# Patient Record
Sex: Male | Born: 1975 | Race: White | Hispanic: No | Smoking: Never smoker
Health system: Southern US, Community
[De-identification: ages and names within clinical notes are randomized; demographics above are authoritative.]

## PROBLEM LIST (undated history)

## (undated) DIAGNOSIS — I10 Essential (primary) hypertension: Secondary | ICD-10-CM

## (undated) DIAGNOSIS — R011 Cardiac murmur, unspecified: Secondary | ICD-10-CM

## (undated) DIAGNOSIS — E119 Type 2 diabetes mellitus without complications: Secondary | ICD-10-CM

## (undated) HISTORY — PX: KNEE SURGERY: SHX244

---

## 2014-09-19 ENCOUNTER — Ambulatory Visit (HOSPITAL_COMMUNITY)
Admission: RE | Admit: 2014-09-19 | Discharge: 2014-09-19 | Disposition: A | Discharge status: Home / Self Care | Payer: BLUE CROSS/BLUE SHIELD | Source: Ambulatory Visit | Attending: Internal Medicine | Admitting: Internal Medicine

## 2014-09-19 ENCOUNTER — Other Ambulatory Visit (HOSPITAL_COMMUNITY): Payer: Self-pay | Admitting: Internal Medicine

## 2014-09-19 DIAGNOSIS — M25511 Pain in right shoulder: Secondary | ICD-10-CM | POA: Diagnosis present

## 2016-04-12 ENCOUNTER — Observation Stay (HOSPITAL_COMMUNITY)
Admission: EM | Admit: 2016-04-12 | Discharge: 2016-04-13 | Disposition: A | Discharge status: Home / Self Care | Payer: BLUE CROSS/BLUE SHIELD | Attending: Family Medicine | Admitting: Family Medicine

## 2016-04-12 ENCOUNTER — Emergency Department (HOSPITAL_COMMUNITY): Payer: BLUE CROSS/BLUE SHIELD

## 2016-04-12 ENCOUNTER — Encounter (HOSPITAL_COMMUNITY): Payer: Self-pay | Admitting: Emergency Medicine

## 2016-04-12 DIAGNOSIS — R55 Syncope and collapse: Secondary | ICD-10-CM | POA: Diagnosis not present

## 2016-04-12 DIAGNOSIS — Z7984 Long term (current) use of oral hypoglycemic drugs: Secondary | ICD-10-CM | POA: Insufficient documentation

## 2016-04-12 DIAGNOSIS — R0789 Other chest pain: Secondary | ICD-10-CM | POA: Diagnosis present

## 2016-04-12 DIAGNOSIS — E119 Type 2 diabetes mellitus without complications: Secondary | ICD-10-CM | POA: Diagnosis not present

## 2016-04-12 DIAGNOSIS — I10 Essential (primary) hypertension: Secondary | ICD-10-CM

## 2016-04-12 DIAGNOSIS — R079 Chest pain, unspecified: Principal | ICD-10-CM

## 2016-04-12 DIAGNOSIS — Z79899 Other long term (current) drug therapy: Secondary | ICD-10-CM | POA: Diagnosis not present

## 2016-04-12 HISTORY — DX: Essential (primary) hypertension: I10

## 2016-04-12 HISTORY — DX: Cardiac murmur, unspecified: R01.1

## 2016-04-12 HISTORY — DX: Type 2 diabetes mellitus without complications: E11.9

## 2016-04-12 LAB — BASIC METABOLIC PANEL
ANION GAP: 9 (ref 5–15)
BUN: 10 mg/dL (ref 6–20)
CALCIUM: 9.5 mg/dL (ref 8.9–10.3)
CO2: 21 mmol/L — AB (ref 22–32)
CREATININE: 0.82 mg/dL (ref 0.61–1.24)
Chloride: 107 mmol/L (ref 101–111)
GFR calc non Af Amer: >60 mL/min (ref >60)
Glucose, Bld: 107 mg/dL — ABNORMAL HIGH (ref 65–99)
Potassium: 3.8 mmol/L (ref 3.5–5.1)
Sodium: 137 mmol/L (ref 135–145)

## 2016-04-12 LAB — CBC
HEMATOCRIT: 40.7 % (ref 39.0–52.0)
Hemoglobin: 14.4 g/dL (ref 13.0–17.0)
MCH: 30.1 pg (ref 26.0–34.0)
MCHC: 35.4 g/dL (ref 30.0–36.0)
MCV: 85.1 fL (ref 78.0–100.0)
Platelets: 431 10*3/uL — ABNORMAL HIGH (ref 150–400)
RBC: 4.78 MIL/uL (ref 4.22–5.81)
RDW: 12.5 % (ref 11.5–15.5)
WBC: 9 10*3/uL (ref 4.0–10.5)

## 2016-04-12 LAB — I-STAT TROPONIN, ED: TROPONIN I, POC: 0 ng/mL (ref 0.00–0.08)

## 2016-04-12 LAB — TROPONIN I

## 2016-04-12 MED ORDER — ACETAMINOPHEN 325 MG PO TABS
650.0000 mg | ORAL_TABLET | ORAL | Status: DC | PRN
Start: 2016-04-12 — End: 2016-04-13

## 2016-04-12 MED ORDER — ASPIRIN EC 325 MG PO TBEC
325.0000 mg | DELAYED_RELEASE_TABLET | Freq: Every day | ORAL | Status: DC
  Administered 2016-04-12 – 2016-04-13 (×2): 325 mg via ORAL
  Filled 2016-04-12 (×2): qty 1

## 2016-04-12 MED ORDER — AMLODIPINE-OLMESARTAN 10-40 MG PO TABS: 1.0000 | ORAL_TABLET | Freq: Every day | ORAL | Status: DC

## 2016-04-12 MED ORDER — NITROGLYCERIN 0.4 MG SL SUBL
0.4000 mg | SUBLINGUAL_TABLET | Freq: Once | SUBLINGUAL | Status: AC
  Administered 2016-04-12: 0.4 mg via SUBLINGUAL
  Filled 2016-04-12: qty 1

## 2016-04-12 MED ORDER — ASPIRIN 325 MG PO TABS
325.0000 mg | ORAL_TABLET | Freq: Once | ORAL | Status: AC
  Administered 2016-04-12: 325 mg via ORAL
  Filled 2016-04-12: qty 1

## 2016-04-12 MED ORDER — IRBESARTAN 300 MG PO TABS
300.0000 mg | ORAL_TABLET | Freq: Every day | ORAL | Status: DC
  Administered 2016-04-13: 300 mg via ORAL
  Filled 2016-04-12: qty 1

## 2016-04-12 MED ORDER — AMLODIPINE BESYLATE 5 MG PO TABS
10.0000 mg | ORAL_TABLET | Freq: Every day | ORAL | Status: DC
  Administered 2016-04-13: 10 mg via ORAL
  Filled 2016-04-12: qty 2

## 2016-04-12 MED ORDER — ONDANSETRON HCL 4 MG/2ML IJ SOLN: 4.0000 mg | Freq: Four times a day (QID) | INTRAMUSCULAR | Status: DC | PRN

## 2016-04-12 MED ORDER — GI COCKTAIL ~~LOC~~: 30.0000 mL | Freq: Four times a day (QID) | ORAL | Status: DC | PRN

## 2016-04-12 NOTE — ED Notes (Signed)
Pt ready and stable for transport to [facility] room 335.

## 2016-04-12 NOTE — ED Notes (Signed)
Pharmacy at bedside

## 2016-04-12 NOTE — ED Triage Notes (Signed)
Patient c/o left side chest pain that radiates into left "shoulder blade." Per patient pain started around 2pm in which he became diaphoretic and nauseated. Patient states he called girfriend at 2ish and left message telling her he wasn't feeling good and then waking up face down on floor with dog licking face around 4. Per patient left arm was cold upon waking and inconstant temp change in arm noted. Denies any cardiac hx other then heart murmur.

## 2016-04-12 NOTE — H&P (Signed)
History and Physical    LAWERNCE EARLL XAJ:[id number] DOB: [birth year] DOA: 04/12/2016  PCP: Wende Neighbors, MD  Patient coming from:  home  Chief Complaint:   Chest pain and passed out  HPI: Christian Peters is a 40 y.o. male with medical history significant of HTN, DM comes in with acute on chronic episode of chest pain with associated syncope today.  Pt says he has been having these episodes of pain to the left lateral chest for years, it comes on suddenly and lasts for less than 5 minutes usually, usually associated with exertion not associated with shoulder use.  He went to see his PCP saw the NP there who did an ekg and blood work told him everything was fine.  She called him Friday and said she may be setting up additional cardiac work up and they would call back the next week.  Tonight pt was sitting on the sofa when he got sudden severe left chest pain that was different this time, it radiated down the left chest more central this time and radiated down his left arm with associated diaphoresis.  He got up to go the bathroom and the next thing he remembered he was woken up on the floor by the dog licking his face, he says he was passed out for 2 hours.  He had no urinary or bowel incontinence.  He awoke and was generally weak but was able to get up and move around.  The pain is gone now.  He has never had a cardiac work up.  He denies any focal neurological deficits at this time.he was told by his PCP to start taking a baby aspirin a day but he has not started this yet.  He denies any fevers, cough.  No le edema or swelling.  No worsening of pain with deep inspiration.  Review of Systems: As per HPI otherwise 10 point review of systems negative.   Past Medical History:  Diagnosis Date  . Diabetes mellitus without complication ([place])   . Heart murmur   . Hypertension     Past Surgical History:  Procedure Laterality Date  . KNEE SURGERY Right    x4     reports that he has never smoked. He has  never used smokeless tobacco. He reports that he does not drink alcohol or use drugs.  No Known Allergies  Family History  Problem Relation Age of Onset  . Cancer Mother   . Cancer Father     Prior to Admission medications   Medication Sig Start Date End Date Taking? Authorizing Provider  amLODipine-olmesartan (AZOR) 10-40 MG tablet Take 1 tablet by mouth daily.  04/10/16  Yes Historical Provider, MD  metFORMIN (GLUCOPHAGE) 500 MG tablet Take 500 mg by mouth 2 (two) times daily with a meal.   Yes Historical Provider, MD  clonazePAM (KLONOPIN) 0.5 MG tablet Take 0.5 mg by mouth daily as needed for anxiety.  04/08/16   Historical Provider, MD    Physical Exam: Vitals:   04/12/16 1830 04/12/16 1840 04/12/16 1845 04/12/16 1900  BP: 116/84 115/86 122/92 136/91  Pulse: 83 79 87 81  Resp: 14 16 16 19   Temp:      TempSrc:      SpO2: 96% 97% 97% 96%  Weight:      Height:          Constitutional: NAD, calm, comfortable Vitals:   04/12/16 1830 04/12/16 1840 04/12/16 1845 04/12/16 1900  BP: 116/84 115/86 122/92 136/91  Pulse: 83 79 87 81  Resp: 14 16 16 19   Temp:      TempSrc:      SpO2: 96% 97% 97% 96%  Weight:      Height:       Eyes: PERRL, lids and conjunctivae normal ENMT: Mucous membranes are moist. Posterior pharynx clear of any exudate or lesions.Normal dentition.  Neck: normal, supple, no masses, no thyromegaly Respiratory: clear to auscultation bilaterally, no wheezing, no crackles. Normal respiratory effort. No accessory muscle use.  Cardiovascular: Regular rate and rhythm, no murmurs / rubs / gallops. No extremity edema. 2+ pedal pulses. No carotid bruits.  Abdomen: no tenderness, no masses palpated. No hepatosplenomegaly. Bowel sounds positive.  Musculoskeletal: no clubbing / cyanosis. No joint deformity upper and lower extremities. Good ROM, no contractures. Normal muscle tone.  Skin: no rashes, lesions, ulcers. No induration Neurologic: CN 2-12 grossly intact.  Sensation intact, DTR normal. Strength 5/5 in all 4.  Psychiatric: Normal judgment and insight. Alert and oriented x 3. Normal mood.    Labs on Admission: I have personally reviewed following labs and imaging studies  CBC:  Recent Labs Lab 04/12/16 1824  WBC 9.0  HGB 14.4  HCT 40.7  MCV 85.1  PLT 106*   Basic Metabolic Panel:  Recent Labs Lab 04/12/16 1750  NA 137  K 3.8  CL 107  CO2 21*  GLUCOSE 107*  BUN 10  CREATININE 0.82  CALCIUM 9.5   GFR: Estimated Creatinine Clearance: 173.9 mL/min (by C-G formula based on SCr of 0.82 mg/dL).  Radiological Exams on Admission: Dg Chest 2 View  Result Date: 04/12/2016 CLINICAL DATA:  Left-sided chest pain EXAM: CHEST  2 VIEW COMPARISON:  None. FINDINGS: Normal cardiac silhouette. There is elevation of the RIGHT hemidiaphragm. No effusion, infiltrate pneumothorax. IMPRESSION: Elevation RIGHT hemidiaphragm. No acute cardiopulmonary process. Electronically Signed   By: Suzy Bouchard M.D.   On: 04/12/2016 18:07    EKG: Independently reviewed. nsr no acute issues   Assessment/Plan 40 yo male with DM, HTN comes in with waxing/waning atypical chest pain with syncopal episode today  Principal Problem:   Chest pain- aspirin daily.  Romi.  Cardiac monitoring.  Obtain cardiac echo in the am and pt will need stress testing at some point as an outpatient if all work up here negative.  Active Problems:   Syncope- this sounds vasovagal but pt reports he was passed out for 2 hours.  No neuro symptoms.  No history concerning for seizure.  Monitor overnight.  No further imaging needed at this time   Hypertension- stable   Diabetes mellitus without complication ([place])- hold metformin     DVT prophylaxis:  scds Code Status:   full Family Communication: none Disposition Plan:  Per day team Consults called:  none Admission status:  obs   DAVID,RACHAL A MD [facility]  If 7PM-7AM, please contact  night-coverage [email or web address] Password [email or web address]  04/12/2016, 7:20 PM

## 2016-04-12 NOTE — ED Provider Notes (Signed)
Emergency Department Provider Note   I have reviewed the triage vital signs and the nursing notes.   HISTORY  Chief Complaint Chest Pain   HPI Christian Peters is a 40 y.o. male with past medical history of diabetes and hypertension presents to the emergency department for evaluation of acute on chronic chest pain and syncope. The patient states he has had intermittent chest pain for several years but over the past 1-2 weeks that's been worsening. Today he was sitting on the couch when he began having his typical chest discomfort but then noted that it was significantly more painful and slightly different location. He began to feel nauseated and had diaphoresis. Patient states that thing here members he woke up on the floor 2 hours later being licked on the face by his dog. He continued to have chest discomfort and at that time presented to the hospital.No prior history of heart attack. No recent stress test or echocardiogram.    Past Medical History:  Diagnosis Date  . Diabetes mellitus without complication ([place])   . Heart murmur   . Hypertension     There are no active problems to display for this patient.   Past Surgical History:  Procedure Laterality Date  . KNEE SURGERY Right    x4    Current Outpatient Rx  . Order #: [id number] Class: Historical Med  . Order #: [id number] Class: Historical Med  . Order #: [id number] Class: Historical Med    Allergies Review of patient's allergies indicates no known allergies.  Family History  Problem Relation Age of Onset  . Cancer Mother   . Cancer Father     Social History Social History  Substance Use Topics  . Smoking status: Never Smoker  . Smokeless tobacco: Never Used  . Alcohol use No    Review of Systems  Constitutional: No fever/chills Eyes: No visual changes. ENT: No sore throat. Cardiovascular: Positive chest pain and syncope.  Respiratory: Denies shortness of breath. Gastrointestinal: No abdominal pain.   No nausea, no vomiting.  No diarrhea.  No constipation. Genitourinary: Negative for dysuria. Musculoskeletal: Negative for back pain. Skin: Negative for rash. Neurological: Negative for headaches, focal weakness or numbness.  10-point ROS otherwise negative.  ____________________________________________   PHYSICAL EXAM:  VITAL SIGNS: ED Triage Vitals  Enc Vitals Group     BP 04/12/16 1730 (!) 170/103     Pulse Rate 04/12/16 1730 95     Resp 04/12/16 1730 22     Temp 04/12/16 1731 98.9 F (37.2 C)     Temp Source 04/12/16 1731 Oral     SpO2 04/12/16 1730 98 %     Weight 04/12/16 1729 294 lb (133.4 kg)     Height 04/12/16 1729 6' 2"  (1.88 m)     Pain Score 04/12/16 1729 8   Constitutional: Alert and oriented. Well appearing and in no acute distress. Eyes: Conjunctivae are normal.  Head: Atraumatic. Nose: No congestion/rhinnorhea. Mouth/Throat: Mucous membranes are moist.  Oropharynx non-erythematous. Neck: No stridor.  Cardiovascular: Normal rate, regular rhythm. Good peripheral circulation. Grossly normal heart sounds.   Respiratory: Normal respiratory effort.  No retractions. Lungs CTAB. Gastrointestinal: Soft and nontender. No distention.  Musculoskeletal: No lower extremity tenderness nor edema. No gross deformities of extremities. Neurologic:  Normal speech and language. No gross focal neurologic deficits are appreciated.  Skin:  Skin is warm, dry and intact. No rash noted. Psychiatric: Mood and affect are normal. Speech and behavior are normal.  ____________________________________________   LABS (  all labs ordered are listed, but only abnormal results are displayed)  Labs Reviewed  BASIC METABOLIC PANEL - Abnormal; Notable for the following:       Result Value   CO2 21 (*)    Glucose, Bld 107 (*)    All other components within normal limits  CBC - Abnormal; Notable for the following:    Platelets 431 (*)    All other components within normal limits  I-STAT  TROPOININ, ED   ____________________________________________  EKG   EKG Interpretation  Date/Time:  Saturday April 12 2016 18:20:04 EDT Ventricular Rate:  81 PR Interval:    QRS Duration: 115 QT Interval:  372 QTC Calculation: 432 R Axis:   45 Text Interpretation:  Sinus rhythm Nonspecific intraventricular conduction delay No STEMI. Improved baseline.  Confirmed by Dakota Stangl MD, Laloni Rowton [id number] on 04/12/2016 6:42:57 PM       EKG Interpretation  Date/Time:  Saturday April 12 2016 18:20:04 EDT Ventricular Rate:  81 PR Interval:    QRS Duration: 115 QT Interval:  372 QTC Calculation: 432 R Axis:   45 Text Interpretation:  Sinus rhythm Nonspecific intraventricular conduction delay No STEMI. Improved baseline.  Confirmed by Shivon Hackel MD, Zak Gondek [id number] on 04/12/2016 6:42:57 PM       ____________________________________________  RADIOLOGY  Dg Chest 2 View  Result Date: 04/12/2016 CLINICAL DATA:  Left-sided chest pain EXAM: CHEST  2 VIEW COMPARISON:  None. FINDINGS: Normal cardiac silhouette. There is elevation of the RIGHT hemidiaphragm. No effusion, infiltrate pneumothorax. IMPRESSION: Elevation RIGHT hemidiaphragm. No acute cardiopulmonary process. Electronically Signed   By: Suzy Bouchard M.D.   On: 04/12/2016 18:07   ____________________________________________   PROCEDURES  Procedure(s) performed:   Procedures  None ____________________________________________   INITIAL IMPRESSION / ASSESSMENT AND PLAN / ED COURSE  Pertinent labs & imaging results that were available during my care of the patient were reviewed by me and considered in my medical decision making (see chart for details).  Patient resents to the emergency department for evaluation of chest pain and syncope. His chest pain is worsening and slightly different location today. The patient's story is very concerning. His EKG shows no significant ischemia or primary arrhythmia. Labs pending. We'll give  aspirin and nitroglycerin.  07:03 PM Patient with no chest pain currently. EKG and repeat EKG reviewed. Troponin negative. Plan for admission for syncope evaluation and further monitoring. Discussed this with the patient and friends at bedside.   Discussed patient's case with hospitalist, Dr. Shanon Brow.  Recommend admission to obs, telemetry bed.  I will place holding orders per their request. Patient and family (if present) updated with plan. Care transferred to hospitalist service.  I reviewed all nursing notes, vitals, pertinent old records, EKGs, labs, imaging (as available).  ____________________________________________  FINAL CLINICAL IMPRESSION(S) / ED DIAGNOSES  Final diagnoses:  Syncope and collapse  Nonspecific chest pain     MEDICATIONS GIVEN DURING THIS VISIT:  Medications  aspirin tablet 325 mg (325 mg Oral Given 04/12/16 1819)  nitroGLYCERIN (NITROSTAT) SL tablet 0.4 mg (0.4 mg Sublingual Given 04/12/16 1820)     NEW OUTPATIENT MEDICATIONS STARTED DURING THIS VISIT:  None   Note:  This document was prepared using Dragon voice recognition software and may include unintentional dictation errors.  Nanda Quinton, MD Emergency Medicine   Margette Fast, MD 04/13/16 1128

## 2016-04-13 ENCOUNTER — Observation Stay (HOSPITAL_BASED_OUTPATIENT_CLINIC_OR_DEPARTMENT_OTHER): Payer: BLUE CROSS/BLUE SHIELD

## 2016-04-13 DIAGNOSIS — R55 Syncope and collapse: Secondary | ICD-10-CM | POA: Diagnosis not present

## 2016-04-13 DIAGNOSIS — E119 Type 2 diabetes mellitus without complications: Secondary | ICD-10-CM | POA: Diagnosis not present

## 2016-04-13 DIAGNOSIS — I10 Essential (primary) hypertension: Secondary | ICD-10-CM | POA: Diagnosis not present

## 2016-04-13 DIAGNOSIS — R079 Chest pain, unspecified: Secondary | ICD-10-CM | POA: Diagnosis not present

## 2016-04-13 LAB — TROPONIN I: Troponin I: <0.03 ng/mL (ref <0.03)

## 2016-04-13 LAB — ECHOCARDIOGRAM COMPLETE
HEIGHTINCHES: 74 in
WEIGHTICAEL: 4657.6 [oz_av]

## 2016-04-13 MED ORDER — SODIUM CHLORIDE 0.9% FLUSH: 3.0000 mL | INTRAVENOUS | Status: DC | PRN

## 2016-04-13 MED ORDER — ASPIRIN EC 81 MG PO TBEC: 81.0000 mg | DELAYED_RELEASE_TABLET | Freq: Every day | ORAL | Status: DC

## 2016-04-13 MED ORDER — SODIUM CHLORIDE 0.9% FLUSH
3.0000 mL | Freq: Two times a day (BID) | INTRAVENOUS | Status: DC
  Administered 2016-04-13: 3 mL via INTRAVENOUS

## 2016-04-13 MED ORDER — SODIUM CHLORIDE 0.9 % IV SOLN: 250.0000 mL | INTRAVENOUS | Status: DC | PRN

## 2016-04-13 NOTE — Progress Notes (Signed)
  Echocardiogram 2D Echocardiogram has been performed.  Christian Peters 04/13/2016, 10:45 AM

## 2016-04-13 NOTE — Progress Notes (Signed)
PROGRESS NOTE  Christian Peters NAT:[id number] DOB: [birth year] DOA: 04/12/2016 PCP: Christian Neighbors, MD  Brief Narrative: 40 year-old man with a hx of HTN and DM type 2 presented with CP and an associated episode of syncope. While in the ED, afebrile, hypertensive, both BMP and CBC are unremarkable other than his elevated platelet count of 431. EKG and CXR were negative. He was admitted for further evaluation of his CP.  Assessment/Plan: 1. CP. Some typical and atypical features. Troponins negative. EKG nonacute. Echocardiogram reassuring. 2. Syncope, history suggests vasovagal. The patient was sitting, started have some pain. Stood up and started walking and passed out. Afterwards he felt nauseous. Telemetry unrevealing. No further evaluation suggested at this point. 3. HTN. Currently stable.  4. DM type 2, stable.     Home today.  Discussed in detail with the patient. He's had intermittent chest pain for years. Recently he's had increased stress secondary to personal relationship issues. He's had more chest pain as of late. While his symptoms are concerning, coronary disease is not felt to be highly likely at this point. We discussed further hospitalization versus discharge outpatient follow-up. He elected outpatient follow-up. I recommended close follow-up with his PCP and consideration of outpatient stress testing. He will return for chest pain, recurrent symptoms or worsening of condition. This was discussed in detail.  Summary of chart review: Records examined, no previous office visits or hospitalizations on file.  DVT prophylaxis: SCDs Code Status: Full Family Communication: No family at bedside Disposition Plan: Discharge home once improved  Christian Hodgkins, MD  [facility] Direct contact: [phone] --[address] [place]  --[email or web address]; password [email or web address]  7PM-7AM contact night coverage as above 04/13/2016, 5:08 PM  LOS: 0 days   Consultants:  None  Procedures:  ECHO Left  ventricle: The cavity size was normal. Wall thickness was   increased in a pattern of moderate LVH. Systolic function was   normal. The estimated ejection fraction was in the range of 60%   to 65%. Wall motion was normal; there were no regional wall   motion abnormalities. Left ventricular diastolic function   parameters were normal. - Aortic valve: Valve area (VTI): 3.42 cm^2. Valve area (Vmax):   3.16 cm^2. Valve area (Vmean): 2.92 cm^2. - Technically adequate study.  Antimicrobials:  None  CC: Follow-up CP  Interval history/Subjective: He feels good today. He is tender in the left chest area where he fell secondary syncope.   Yesterday he was sitting in his chair watching football when he started to have CP and sweat. He stood up and started walking when his legs started to shake and he had a syncopal episode. Once he woke up, he started to feel nauseated. He admits to some difficulty breathing at that time with no past history of dyspnea. He also admits to having CP a few times a week. He denies any jaw pain.  He is currently otherwise pain free  ROS:  No dyspnea.  Objective: Vitals:   04/12/16 1915 04/12/16 1930 04/12/16 2016 04/13/16 0623  BP: 126/88 126/86 (!) 146/95 133/83  Pulse: 76 74 80 67  Resp: (!) 9 13 20 18   Temp:   98.5 F (36.9 C) 97.7 F (36.5 C)  TempSrc:   Oral Oral  SpO2: 98% 99% 99% 97%  Weight:   132 kg (291 lb 1.6 oz)   Height:   6' 2"  (1.88 m)     Intake/Output Summary (Last 24 hours) at 04/13/16 1708 Last data filed at 04/13/16  1014  Gross per 24 hour  Intake              363 ml  Output                0 ml  Net              363 ml     Filed Weights   04/12/16 1729 04/12/16 2016  Weight: 133.4 kg (294 lb) 132 kg (291 lb 1.6 oz)    Exam:    Constitutional:  . Appears calm and comfortable Respiratory:  . CTA bilaterally, no w/r/r.  . Respiratory effort normal. No retractions or accessory muscle use Cardiovascular:  . RRR, no  m/r/g . No LE extremity edema   Musculoskeletal:   Left radial pulse is 2+. Perfusion of the left hip appears normal.  I have personally reviewed following labs and imaging studies:  BMP, CBC on admission unremarkable  Troponins negative  Chest x-ray independently reviewed, elevated right hemidiaphragm. No acute disease noted. In addition I agree with radiology interpretation.  EKG 1729, 1820 independently reviewed. Sinus rhythm. No acute changes.  Scheduled Meds: . amLODipine  10 mg Oral Daily   And  . irbesartan  300 mg Oral Daily  . aspirin EC  325 mg Oral Daily  . sodium chloride flush  3 mL Intravenous Q12H   Continuous Infusions:   Principal Problem:   Nonspecific chest pain Active Problems:   Syncope and collapse   Essential hypertension   Diabetes mellitus without complication (Christian Peters)   LOS: 0 days    By signing my name below, I, Christian Peters, attest that this documentation has been prepared under the direction and in the presence of Christian P. Sarajane Jews, MD. Electronically signed: [name], Scribe.  04/13/16, 12:48 PM  I personally performed the services described in this documentation. All medical record entries made by the scribe were at my direction. I have reviewed the chart and agree that the record reflects my personal performance and is accurate and complete. Christian Hodgkins, MD

## 2016-04-13 NOTE — Discharge Summary (Signed)
Physician Discharge Summary  Christian Peters:[id number] DOB: 12-24-1975 DOA: 04/12/2016  PCP: Wende Neighbors, MD  Admit date: 04/12/2016 Discharge date: 04/13/2016  Recommendations for Outpatient Follow-up:  1. Follow up with PCP as an outpatient. Consider outpatient nuclear stress testing.   Follow-up Information    Wende Neighbors, MD. Schedule an appointment as soon as possible for a visit in 1 week(s).   Specialty:  Internal Medicine Contact information: [place] [address] [phone]          Discharge Diagnoses:  1. CPOf unclear etiology 2. Syncope, likely vasovagal 3. Essential HTN 4. DM, type 2  Discharge Condition: Improved Disposition: Home  Diet recommendation: Carb-modified  Filed Weights   04/12/16 1729 04/12/16 2016  Weight: 133.4 kg (294 lb) 132 kg (291 lb 1.6 oz)    History of present illness:  40 year-old man with a hx of HTN and DM type 2 presented with CP and an associated episode of syncope. While in the ED, afebrile, hypertensive, both BMP and CBC are unremarkable other than his elevated platelet count of 431. EKG and CXR were negative. He was admitted for further evaluation of his CP.  Hospital Course:  Observation was unremarkable. No further pain. Telemetry unremarkable. EKG nonacute. Troponins negative. Hospitalization was uncomplicated. Discussed in detail with the patient. He's had intermittent chest pain for years. Recently he's had increased stress secondary to personal relationship issues. He's had more chest pain as of late. While his symptoms are concerning, coronary disease is not felt to be highly likely at this point. We discussed further hospitalization versus discharge outpatient follow-up. He elected outpatient follow-up. I recommended close follow-up with his PCP and consideration of outpatient stress testing. He will return for chest pain, recurrent symptoms or worsening of condition. This was discussed in  detail.  1. CP. Some typical and atypical features. Troponins negative. EKG nonacute. Echocardiogram reassuring. 2. Syncope, history suggests vasovagal. The patient was sitting, started have some pain. Stood up and started walking and passed out. Afterwards he felt nauseous. Telemetry unrevealing. No further evaluation suggested at this point. 3. HTN. Currently stable.  4. DM type 2, stable.   Consultants:  None  Procedures:  ECHO  Left ventricle: The cavity size was normal. Wall thickness was increased in a pattern of moderate LVH. Systolic function was normal. The estimated ejection fraction was in the range of 60% to 65%. Wall motion was normal; there were no regional wall motion abnormalities. Left ventricular diastolic function parameters were normal. - Aortic valve: Valve area (VTI): 3.42 cm^2. Valve area (Vmax): 3.16 cm^2. Valve area (Vmean): 2.92 cm^2. - Technically adequate study.   Antimicrobials:  None   Discharge Instructions  Discharge Instructions    Activity as tolerated - No restrictions    Complete by:  As directed    Diet - low sodium heart healthy    Complete by:  As directed    Diet Carb Modified    Complete by:  As directed    Discharge instructions    Complete by:  As directed    Call your physician or seek immediate medical attention for chest pain, shortness of breath, vomiting, passing out, left arm pain or worsening of condition.       Medication List    STOP taking these medications   clonazePAM 0.5 MG tablet Commonly known as:  KLONOPIN     TAKE these medications   amLODipine-olmesartan 10-40 MG tablet Commonly known as:  AZOR Take 1  tablet by mouth daily.   aspirin EC 81 MG tablet Take 1 tablet (81 mg total) by mouth daily.   metFORMIN 500 MG tablet Commonly known as:  GLUCOPHAGE Take 500 mg by mouth 2 (two) times daily with a meal.      No Known Allergies  The results of significant diagnostics from this  hospitalization (including imaging, microbiology, ancillary and laboratory) are listed below for reference.    Significant Diagnostic Studies: Dg Chest 2 View  Result Date: 04/12/2016 CLINICAL DATA:  Left-sided chest pain EXAM: CHEST  2 VIEW COMPARISON:  None. FINDINGS: Normal cardiac silhouette. There is elevation of the RIGHT hemidiaphragm. No effusion, infiltrate pneumothorax. IMPRESSION: Elevation RIGHT hemidiaphragm. No acute cardiopulmonary process. Electronically Signed   By: Suzy Bouchard M.D.   On: 04/12/2016 18:07   Labs: Basic Metabolic Panel:  Recent Labs Lab 04/12/16 1750  NA 137  K 3.8  CL 107  CO2 21*  GLUCOSE 107*  BUN 10  CREATININE 0.82  CALCIUM 9.5   CBC:  Recent Labs Lab 04/12/16 1824  WBC 9.0  HGB 14.4  HCT 40.7  MCV 85.1  PLT 431*   Cardiac Enzymes:  Recent Labs Lab 04/12/16 2129 04/13/16 0018 04/13/16 0349  TROPONINI <0.03 <0.03 <0.03    Principal Problem:   Nonspecific chest pain Active Problems:   Syncope and collapse   Essential hypertension   Diabetes mellitus without complication ([place])   Time coordinating discharge: 35 minutes  Signed:  Murray Hodgkins, MD [facility] 04/13/2016, 5:11 PM   By signing my name below, I, Hilbert Odor, attest that this documentation has been prepared under the direction and in the presence of Kasidee Voisin P. Sarajane Jews, MD. Electronically signed: Hilbert Odor, Scribe.  04/13/16   I personally performed the services described in this documentation. All medical record entries made by the scribe were at my direction. I have reviewed the chart and agree that the record reflects my personal performance and is accurate and complete. Murray Hodgkins, MD

## 2016-04-13 NOTE — Progress Notes (Signed)
Discharge instructions given, verbalized understanding, out in stable condition ambulatory.

## 2016-04-14 ENCOUNTER — Telehealth (HOSPITAL_COMMUNITY): Payer: Self-pay | Admitting: *Deleted

## 2016-04-14 ENCOUNTER — Other Ambulatory Visit: Payer: Self-pay | Admitting: Adult Health Nurse Practitioner

## 2016-04-14 DIAGNOSIS — I10 Essential (primary) hypertension: Secondary | ICD-10-CM

## 2016-04-14 DIAGNOSIS — I4891 Unspecified atrial fibrillation: Secondary | ICD-10-CM

## 2016-04-14 DIAGNOSIS — R079 Chest pain, unspecified: Secondary | ICD-10-CM

## 2016-04-14 NOTE — Telephone Encounter (Signed)
Patient given detailed instructions per Myocardial Perfusion Study Information Sheet for the test on 04/15/16 at 12:45. Patient notified to arrive 15 minutes early and that it is imperative to arrive on time for appointment to keep from having the test rescheduled.  If you need to cancel or reschedule your appointment, please call the office within 24 hours of your appointment. Failure to do so may result in a cancellation of your appointment, and a $50 no show fee. Patient verbalized understanding.Christian Peters

## 2016-04-15 ENCOUNTER — Ambulatory Visit (HOSPITAL_COMMUNITY): Payer: BLUE CROSS/BLUE SHIELD

## 2016-04-16 ENCOUNTER — Ambulatory Visit (HOSPITAL_COMMUNITY): Payer: BLUE CROSS/BLUE SHIELD

## 2016-04-16 ENCOUNTER — Ambulatory Visit (HOSPITAL_COMMUNITY): Payer: BLUE CROSS/BLUE SHIELD | Attending: Cardiovascular Disease

## 2016-04-16 DIAGNOSIS — I10 Essential (primary) hypertension: Secondary | ICD-10-CM | POA: Insufficient documentation

## 2016-04-16 DIAGNOSIS — R079 Chest pain, unspecified: Secondary | ICD-10-CM | POA: Insufficient documentation

## 2016-04-16 DIAGNOSIS — I4891 Unspecified atrial fibrillation: Secondary | ICD-10-CM | POA: Insufficient documentation

## 2016-04-16 MED ORDER — TECHNETIUM TC 99M TETROFOSMIN IV KIT
32.8000 | PACK | Freq: Once | INTRAVENOUS | Status: AC | PRN
  Administered 2016-04-16: 32.8 via INTRAVENOUS
  Filled 2016-04-16: qty 33

## 2016-04-16 MED ORDER — REGADENOSON 0.4 MG/5ML IV SOLN
0.4000 mg | Freq: Once | INTRAVENOUS | Status: AC
  Administered 2016-04-16: 0.4 mg via INTRAVENOUS

## 2016-04-17 ENCOUNTER — Ambulatory Visit (HOSPITAL_COMMUNITY): Payer: BLUE CROSS/BLUE SHIELD | Attending: Cardiology

## 2016-04-17 LAB — MYOCARDIAL PERFUSION IMAGING
CHL CUP NUCLEAR SSS: 5
CHL CUP RESTING HR STRESS: 92 {beats}/min
CSEPPHR: 117 {beats}/min
LHR: 0.24
LV sys vol: 80 mL
LVDIAVOL: 151 mL (ref 62–150)
SDS: 1
SRS: 4
TID: 0.91

## 2016-04-17 MED ORDER — TECHNETIUM TC 99M TETROFOSMIN IV KIT
31.6000 | PACK | Freq: Once | INTRAVENOUS | Status: AC | PRN
  Administered 2016-04-17: 32 via INTRAVENOUS
  Filled 2016-04-17: qty 32

## 2016-04-25 ENCOUNTER — Telehealth: Payer: Self-pay | Admitting: Family Medicine

## 2016-04-25 NOTE — Telephone Encounter (Signed)
Error, disregard.

## 2016-04-29 ENCOUNTER — Other Ambulatory Visit: Payer: Self-pay

## 2016-04-29 ENCOUNTER — Emergency Department (HOSPITAL_COMMUNITY)
Admission: EM | Admit: 2016-04-29 | Discharge: 2016-04-29 | Disposition: A | Discharge status: Home / Self Care | Payer: BLUE CROSS/BLUE SHIELD | Attending: Emergency Medicine | Admitting: Emergency Medicine

## 2016-04-29 ENCOUNTER — Encounter (HOSPITAL_COMMUNITY): Payer: Self-pay | Admitting: Emergency Medicine

## 2016-04-29 ENCOUNTER — Emergency Department (HOSPITAL_COMMUNITY): Payer: BLUE CROSS/BLUE SHIELD

## 2016-04-29 DIAGNOSIS — R51 Headache: Secondary | ICD-10-CM | POA: Insufficient documentation

## 2016-04-29 DIAGNOSIS — R0789 Other chest pain: Secondary | ICD-10-CM | POA: Diagnosis not present

## 2016-04-29 DIAGNOSIS — Z7984 Long term (current) use of oral hypoglycemic drugs: Secondary | ICD-10-CM | POA: Diagnosis not present

## 2016-04-29 DIAGNOSIS — Z7982 Long term (current) use of aspirin: Secondary | ICD-10-CM | POA: Diagnosis not present

## 2016-04-29 DIAGNOSIS — H539 Unspecified visual disturbance: Secondary | ICD-10-CM | POA: Insufficient documentation

## 2016-04-29 DIAGNOSIS — Z79899 Other long term (current) drug therapy: Secondary | ICD-10-CM | POA: Insufficient documentation

## 2016-04-29 DIAGNOSIS — E119 Type 2 diabetes mellitus without complications: Secondary | ICD-10-CM | POA: Diagnosis not present

## 2016-04-29 DIAGNOSIS — R42 Dizziness and giddiness: Secondary | ICD-10-CM | POA: Insufficient documentation

## 2016-04-29 DIAGNOSIS — R0602 Shortness of breath: Secondary | ICD-10-CM | POA: Insufficient documentation

## 2016-04-29 DIAGNOSIS — R112 Nausea with vomiting, unspecified: Secondary | ICD-10-CM | POA: Insufficient documentation

## 2016-04-29 DIAGNOSIS — R61 Generalized hyperhidrosis: Secondary | ICD-10-CM | POA: Insufficient documentation

## 2016-04-29 DIAGNOSIS — R55 Syncope and collapse: Secondary | ICD-10-CM

## 2016-04-29 DIAGNOSIS — H9319 Tinnitus, unspecified ear: Secondary | ICD-10-CM | POA: Diagnosis not present

## 2016-04-29 DIAGNOSIS — R079 Chest pain, unspecified: Secondary | ICD-10-CM | POA: Diagnosis present

## 2016-04-29 DIAGNOSIS — I10 Essential (primary) hypertension: Secondary | ICD-10-CM | POA: Insufficient documentation

## 2016-04-29 LAB — CBC
HEMATOCRIT: 39.7 % (ref 39.0–52.0)
HEMOGLOBIN: 14 g/dL (ref 13.0–17.0)
MCH: 30.1 pg (ref 26.0–34.0)
MCHC: 35.3 g/dL (ref 30.0–36.0)
MCV: 85.4 fL (ref 78.0–100.0)
Platelets: 378 10*3/uL (ref 150–400)
RBC: 4.65 MIL/uL (ref 4.22–5.81)
RDW: 12.7 % (ref 11.5–15.5)
WBC: 8.2 10*3/uL (ref 4.0–10.5)

## 2016-04-29 LAB — BASIC METABOLIC PANEL
ANION GAP: 7 (ref 5–15)
BUN: 11 mg/dL (ref 6–20)
CALCIUM: 9.1 mg/dL (ref 8.9–10.3)
CHLORIDE: 106 mmol/L (ref 101–111)
CO2: 24 mmol/L (ref 22–32)
Creatinine, Ser: 0.8 mg/dL (ref 0.61–1.24)
GFR calc non Af Amer: >60 mL/min (ref >60)
Glucose, Bld: 113 mg/dL — ABNORMAL HIGH (ref 65–99)
POTASSIUM: 3.7 mmol/L (ref 3.5–5.1)
Sodium: 137 mmol/L (ref 135–145)

## 2016-04-29 LAB — URINALYSIS, ROUTINE W REFLEX MICROSCOPIC
BILIRUBIN URINE: NEGATIVE
Glucose, UA: NEGATIVE mg/dL
Hgb urine dipstick: NEGATIVE
Ketones, ur: NEGATIVE mg/dL
LEUKOCYTES UA: NEGATIVE
NITRITE: NEGATIVE
PH: 5.5 (ref 5.0–8.0)
Protein, ur: NEGATIVE mg/dL

## 2016-04-29 LAB — I-STAT TROPONIN, ED
TROPONIN I, POC: 0 ng/mL (ref 0.00–0.08)
TROPONIN I, POC: 0 ng/mL (ref 0.00–0.08)

## 2016-04-29 LAB — TSH: TSH: 0.381 u[IU]/mL (ref 0.350–4.500)

## 2016-04-29 LAB — T4, FREE: Free T4: 0.88 ng/dL (ref 0.61–1.12)

## 2016-04-29 LAB — TROPONIN I: Troponin I: <0.03 ng/mL (ref <0.03)

## 2016-04-29 MED ORDER — IOPAMIDOL (ISOVUE-370) INJECTION 76%
100.0000 mL | Freq: Once | INTRAVENOUS | Status: AC | PRN
  Administered 2016-04-29: 100 mL via INTRAVENOUS

## 2016-04-29 NOTE — ED Triage Notes (Signed)
Pt reports continuing chest pain since his last visit here. Pt reports pain became much worse today and he also has n/v and dizziness. Pt reports the pain is stabbing.

## 2016-04-29 NOTE — ED Notes (Signed)
Patient stated that when he went from lying position to sitting position he got a "slight headache" but it went away upon standing.  Patient denies any dizziness

## 2016-04-29 NOTE — ED Provider Notes (Signed)
[facility] DEPT Provider Note   CSN: [id number] Arrival date & time: 04/29/16  1207  By signing my name below, I, Rayna Sexton, attest that this documentation has been prepared under the direction and in the presence of Julianne Rice, MD. Electronically Signed: Rayna Sexton, ED Scribe. 04/29/16. 12:50 PM.   History   Chief Complaint Chief Complaint  Patient presents with  . Chest Pain    HPI HPI Comments: Christian Peters is a 40 y.o. male with a h/o heart murmur, DM, HTN who presents to the Emergency Department complaining of stabbing, non-radiating, left upper CP onset this morning at 10:00 AM. He states that his pain began three days ago and this morning while driving to work began to experience a worsening of his pain which he now describes as "stabbing". He reports associated nausea, 1x vomiting, HA, diaphoresis, blurry vision, lightheadedness, intermittent bilateral tinnitus with his most recent occurrence being last week, diffuse burning muscle pain and palpitations. He denies any current modifying factors. He had his BP checked this morning and it was 195/91 at the time and notes a h/o HTN. Pt also reports he has had intermittent hypotension and had his HTN medication decreased by 1/2 beginning 8 days ago. His wife states that he experienced an episode of dizziness and syncope yesterday while at a work meeting. He states this is the second occurrence he has lost consciousness. His most recent stress test was performed on 04/16/2016 showed mildly decreased ejection fraction. He denies any recent travel, leg swelling and family history of DVT/PE. He takes one baby aspirin daily but is otherwise not on anticoagulation. Pt denies a h/o cardiac catheterization and has never having worn a Holter monitor or had h/o thyroid issues.   The history is provided by the patient and the spouse. No language interpreter was used.    Past Medical History:  Diagnosis Date  . Diabetes mellitus  without complication ([place])   . Heart murmur   . Hypertension     Patient Active Problem List   Diagnosis Date Noted  . Syncope and collapse 04/12/2016  . Nonspecific chest pain 04/12/2016  . Essential hypertension   . Diabetes mellitus without complication [facility])     Past Surgical History:  Procedure Laterality Date  . KNEE SURGERY Right    x4       Home Medications    Prior to Admission medications   Medication Sig Start Date End Date Taking? Authorizing Provider  amLODipine-olmesartan (AZOR) 10-40 MG tablet Take 1 tablet by mouth daily.  04/10/16  Yes Historical Provider, MD  aspirin EC 81 MG tablet Take 1 tablet (81 mg total) by mouth daily. 04/13/16  Yes Samuella Cota, MD  metFORMIN (GLUCOPHAGE) 500 MG tablet Take 500 mg by mouth 2 (two) times daily with a meal.   Yes Historical Provider, MD    Family History Family History  Problem Relation Age of Onset  . Cancer Mother   . Cancer Father     Social History Social History  Substance Use Topics  . Smoking status: Never Smoker  . Smokeless tobacco: Never Used  . Alcohol use No     Allergies   Review of patient's allergies indicates no known allergies.   Review of Systems Review of Systems  Constitutional: Positive for diaphoresis. Negative for fever.  HENT: Positive for tinnitus. Negative for ear pain.   Eyes: Positive for visual disturbance.  Respiratory: Positive for shortness of breath. Negative for cough, chest tightness and wheezing.  Cardiovascular: Positive for chest pain and palpitations. Negative for leg swelling.  Gastrointestinal: Positive for nausea and vomiting. Negative for abdominal pain, constipation and diarrhea.  Genitourinary: Negative for difficulty urinating, dysuria, flank pain and frequency.  Musculoskeletal: Positive for myalgias. Negative for arthralgias, back pain, neck pain and neck stiffness.  Skin: Negative for rash and wound.  Neurological: Positive for dizziness,  syncope, light-headedness and headaches. Negative for weakness and numbness.  All other systems reviewed and are negative.  Physical Exam Updated Vital Signs BP 122/87   Pulse 71   Temp 98.2 F (36.8 C) (Oral)   Resp 12   Ht 6' 2"  (1.88 m)   Wt 284 lb (128.8 kg)   SpO2 98%   BMI 36.46 kg/m   Physical Exam  Constitutional: He is oriented to person, place, and time. He appears well-developed and well-nourished. No distress.  HENT:  Head: Normocephalic and atraumatic.  Mouth/Throat: Oropharynx is clear and moist. No oropharyngeal exudate.  Eyes: EOM are normal. Pupils are equal, round, and reactive to light.  Several beats of fatigable rotary nystagmus  Neck: Normal range of motion. Neck supple. No JVD present.  Cardiovascular: Normal rate and regular rhythm.   Pulmonary/Chest: Effort normal and breath sounds normal.  Abdominal: Soft. Bowel sounds are normal. There is no tenderness. There is no rebound and no guarding.  Musculoskeletal: Normal range of motion. He exhibits no edema or tenderness.  Neurological: He is alert and oriented to person, place, and time.  Skin: Skin is warm and dry. No rash noted. No erythema.  Psychiatric: He has a normal mood and affect. His behavior is normal.  Nursing note and vitals reviewed.   ED Treatments / Results  Labs (all labs ordered are listed, but only abnormal results are displayed) Labs Reviewed  BASIC METABOLIC PANEL - Abnormal; Notable for the following:       Result Value   Glucose, Bld 113 (*)    All other components within normal limits  URINALYSIS, ROUTINE W REFLEX MICROSCOPIC (NOT AT [facility]) - Abnormal; Notable for the following:    Specific Gravity, Urine >1.030 (*)    All other components within normal limits  CBC  TSH  TROPONIN I  T4, FREE  I-STAT TROPOININ, ED  I-STAT TROPOININ, ED    EKG  EKG Interpretation None       Radiology Dg Chest 2 View  Result Date: 04/29/2016 CLINICAL DATA:  Chest pain.  Syncopal  episodes. EXAM: CHEST  2 VIEW COMPARISON:  04/12/2016 FINDINGS: The cardiomediastinal silhouette is within normal limits. Moderate elevation of the right hemidiaphragm is unchanged. No airspace consolidation, edema, pleural effusion, or pneumothorax is identified. No acute osseous abnormality is seen. IMPRESSION: No evidence of active cardiopulmonary disease. Unchanged right hemidiaphragm elevation. Electronically Signed   By: Logan Bores M.D.   On: 04/29/2016 13:11   Ct Angio Chest Pe W And/or Wo Contrast  Result Date: 04/29/2016 CLINICAL DATA:  Left-sided stabbing chest pain, worsening over the past 2-3 months with acute worsening today. Evaluate for pulmonary embolism. EXAM: CT ANGIOGRAPHY CHEST WITH CONTRAST TECHNIQUE: Multidetector CT imaging of the chest was performed using the standard protocol during bolus administration of intravenous contrast. Multiplanar CT image reconstructions and MIPs were obtained to evaluate the vascular anatomy. CONTRAST:  100 cc Isovue 370 COMPARISON:  Chest radiograph - 04/29/2016 FINDINGS: Cardiovascular: There is suboptimal opacification of the main pulmonary artery, measuring only 184 Hounsfield units. Given this limitation, there no discrete filling defects within the central pulmonary  arterial tree. Evaluation of the bilateral segmental and subsegmental pulmonary arteries is degraded secondary to a combination of suboptimal vessel opacification as well as patient motion artifact. Normal caliber of the main pulmonary artery. Normal heart size. No pericardial effusion. Normal caliber of the thoracic aorta. No definite thoracic aortic dissection on this nongated examination. Conventional configuration of the aortic arch. The branch vessels of the aortic arch appear patent throughout their imaged course. Mediastinum/Nodes: No mediastinal, hilar axillary lymphadenopathy. Lungs/Pleura: Evaluation of the pulmonary parenchyma is degraded secondary to patient respiratory  artifact. No focal airspace opacities. No pleural effusion or pneumothorax. The central pulmonary airways appear widely patent. No discrete pulmonary nodules given limitation of the examination. Upper Abdomen: There is persistent mild elevation of the right hemidiaphragm. Diffuse decreased attenuation of the hepatic parenchyma on this postcontrast examination suggestive of hepatic steatosis. Note is made of a tiny splenule. Musculoskeletal: No acute or aggressive osseous abnormalities. Specifically, no displaced left-sided rib fractures. Degenerative change of the mid/lower cervical spine. Bilateral gynecomastia.  Normal appearance of the thyroid gland. Review of the MIP images confirms the above findings. IMPRESSION: Respiratory degraded examination without acute cardiopulmonary disease. Specifically, no evidence of central pulmonary embolism. Electronically Signed   By: Sandi Mariscal M.D.   On: 04/29/2016 14:36    Procedures Procedures  DIAGNOSTIC STUDIES: Oxygen Saturation is 97% on RA, normal by my interpretation.    COORDINATION OF CARE: 12:50 PM Discussed next steps with pt. Pt verbalized understanding and is agreeable with the plan.    Medications Ordered in ED Medications  iopamidol (ISOVUE-370) 76 % injection 100 mL (100 mLs Intravenous Contrast Given 04/29/16 1416)     Initial Impression / Assessment and Plan / ED Course  I have reviewed the triage vital signs and the nursing notes.  Pertinent labs & imaging results that were available during my care of the patient were reviewed by me and considered in my medical decision making (see chart for details).  Clinical Course    I personally performed the services described in this documentation, which was scribed in my presence. The recorded information has been reviewed and is accurate.   Vital signs stable in the emergency department. Chest pain is improved. CT angiogram negative for PE. EKG with no ischemic findings. Troponin 2 are  normal. Discussed with Dr. Scarlette Calico. Will arrange to have patient follow-up in office in the next one to 2 days. Advised patient not to drive or operate heavy machinery. Understands need to return immediately for any worsening of his symptoms or concerns. Final Clinical Impressions(s) / ED Diagnoses   Final diagnoses:  Atypical chest pain  Syncope, unspecified syncope type    New Prescriptions New Prescriptions   No medications on file     Julianne Rice, MD 04/29/16 1737

## 2016-05-01 ENCOUNTER — Encounter: Payer: Self-pay | Admitting: *Deleted

## 2016-05-01 ENCOUNTER — Encounter: Payer: Self-pay | Admitting: Internal Medicine

## 2016-05-01 ENCOUNTER — Ambulatory Visit (INDEPENDENT_AMBULATORY_CARE_PROVIDER_SITE_OTHER): Payer: BLUE CROSS/BLUE SHIELD | Admitting: Internal Medicine

## 2016-05-01 VITALS — BP 142/98 | HR 92 | Ht 74.0 in | Wt 284.0 lb

## 2016-05-01 DIAGNOSIS — R55 Syncope and collapse: Secondary | ICD-10-CM

## 2016-05-01 DIAGNOSIS — R079 Chest pain, unspecified: Secondary | ICD-10-CM | POA: Diagnosis not present

## 2016-05-01 NOTE — Progress Notes (Signed)
Cardiology Office Note   Date:  05/01/2016   ID:  Christian Peters, DOB October 07, 1975, MRN [id number]  PCP:  Wende Neighbors, MD  Cardiologist:   Dorris Carnes, MD    PT presents for eval of CP and syncope    History of Present Illness: Christian Peters is a 40 y.o. male with a history of DM, HTN Went to ER on 10/17 with CP   Per er visit pt complained ofconstant pain with occasional   sharp stabbing CP L side  Pain began 3 days prior   Some N/V  HA diaphoresis.  Light headed BP 195/91 at time  Note BP med was recently decreased. Did complain of some chest achiness at work   Pt did exerpeicne dizziness/syncope at work meeting  Second episoded   No recent traviel   CT wa sneg for PE  Trop in ER was negatve      Outpatient Medications Prior to Visit  Medication Sig Dispense Refill  . amLODipine-olmesartan (AZOR) 10-40 MG tablet Take 1 tablet by mouth daily.   3  . aspirin EC 81 MG tablet Take 1 tablet (81 mg total) by mouth daily.    . metFORMIN (GLUCOPHAGE) 500 MG tablet Take 500 mg by mouth 2 (two) times daily with a meal.     No facility-administered medications prior to visit.      Allergies:   Review of patient's allergies indicates no known allergies.   Past Medical History:  Diagnosis Date  . Diabetes mellitus without complication ([place])   . Heart murmur   . Hypertension     Past Surgical History:  Procedure Laterality Date  . KNEE SURGERY Right    x4     Social History:  The patient  reports that he has never smoked. He has never used smokeless tobacco. He reports that he does not drink alcohol or use drugs.   Family History:  The patient's family history includes Cancer in his father and mother.    ROS:  Please see the history of present illness. All other systems are reviewed and  Negative to the above problem except as noted.    PHYSICAL EXAM: VS:  BP (!) 142/98   Pulse 92   Ht 6' 2"  (1.88 m)   Wt 284 lb (128.8 kg)   SpO2 96%   BMI 36.46 kg/m   GEN: Well  nourished, well developed, in no acute distress  HEENT: normal  Neck: no JVD, carotid bruits, or masses Cardiac: RRR; no murmurs, rubs, or gallops,no edema  Respiratory:  clear to auscultation bilaterally, normal work of breathing GI: soft, nontender, nondistended, + BS  No hepatomegaly  MS: no deformity Moving all extremities   Skin: warm and dry, no rash Neuro:  Strength and sensation are intact Psych: euthymic mood, full affect   EKG:  EKG is not ordered today.  In ER SR 77 bpm  Normal intervals   Lipid Panel No results found for: CHOL, TRIG, HDL, CHOLHDL, VLDL, LDLCALC, LDLDIRECT    Wt Readings from Last 3 Encounters:  05/01/16 284 lb (128.8 kg)  04/29/16 284 lb (128.8 kg)  04/16/16 297 lb (134.7 kg)      ASSESSMENT AND PLAN:  1  Chest pain  Two distinct types  Atypical  I am not convinced cardiac   I would check WESR and ANA and RF  2  Dizziness/syncope  I am not convinced it is an arrhythmia  Would set up with event monitor  Current medicines are reviewed at length with the patient today.  The patient does not have concerns regarding medicines.  Signed, Dorris Carnes, MD  05/01/2016 4:26 PM    [address], [place], [place]  [address] Phone: [phone]; Fax: [phone]

## 2016-05-01 NOTE — Patient Instructions (Addendum)
Medication Instructions:  Your physician recommends that you continue on your current medications as directed. Please refer to the Current Medication list given to you today.   Labwork: ANA/Rheumatoid factor/ Sed rate today  Testing/Procedures: Your physician has recommended that you wear an event monitor. Event monitors are medical devices that record the heart's electrical activity. Doctors most often [place] these monitors to diagnose arrhythmias. Arrhythmias are problems with the speed or rhythm of the heartbeat. The monitor is a small, portable device. You can wear one while you do your normal daily activities. This is usually used to diagnose what is causing palpitations/syncope (passing out). 30 day event   Follow-Up: You do not need to  schedule a follow-up appointment at this time--follow up will be determined after testing has been completed.         If you need a refill on your cardiac medications before your next appointment, please call your pharmacy.

## 2016-05-02 LAB — SEDIMENTATION RATE: SED RATE: 7 mm/h (ref 0–15)

## 2016-05-02 LAB — ANTI-NUCLEAR AB-TITER (ANA TITER): ANA Titer 1: 1:80 {titer} — ABNORMAL HIGH

## 2016-05-02 LAB — RHEUMATOID FACTOR: Rhuematoid fact SerPl-aCnc: <14 IU/mL (ref <14)

## 2016-05-02 LAB — ANA: Anti Nuclear Antibody(ANA): POSITIVE — AB

## 2016-05-05 ENCOUNTER — Ambulatory Visit: Payer: BLUE CROSS/BLUE SHIELD | Admitting: Physician Assistant

## 2016-05-06 ENCOUNTER — Ambulatory Visit (INDEPENDENT_AMBULATORY_CARE_PROVIDER_SITE_OTHER): Payer: BLUE CROSS/BLUE SHIELD

## 2016-05-06 ENCOUNTER — Telehealth: Payer: Self-pay | Admitting: *Deleted

## 2016-05-06 DIAGNOSIS — R079 Chest pain, unspecified: Secondary | ICD-10-CM

## 2016-05-06 DIAGNOSIS — R55 Syncope and collapse: Secondary | ICD-10-CM | POA: Diagnosis not present

## 2016-05-06 NOTE — Telephone Encounter (Signed)
Patient has questions regarding Lab results (ANA).  Please call.

## 2016-05-08 NOTE — Telephone Encounter (Signed)
Notes Recorded by Rodman Key, RN on 05/08/2016 at 8:52 AM EDT Left detailed message of results on voice mail per DPR on file. ------  Notes Recorded by Fay Records, MD on 05/06/2016 at 2:11 PM EDT Marker of inflammation and rheumatoid factor are normal/negatvie ANA is minimally positive I do nnot think this is specific for any problem  I will forward to Dr Nevada Crane ------

## 2016-06-10 ENCOUNTER — Encounter: Payer: Self-pay | Admitting: Internal Medicine

## 2016-06-11 ENCOUNTER — Other Ambulatory Visit: Payer: Self-pay | Admitting: *Deleted

## 2016-06-11 MED ORDER — METOPROLOL SUCCINATE ER 25 MG PO TB24: 25.0000 mg | ORAL_TABLET | Freq: Two times a day (BID) | ORAL | 11 refills | Status: DC

## 2016-06-12 ENCOUNTER — Telehealth: Payer: Self-pay | Admitting: Internal Medicine

## 2016-06-12 NOTE — Telephone Encounter (Signed)
New message   Patient calling   Pt c/o medication issue:  1. Name of Medication: metoprolol succinate (TOPROL XL) 25 MG 24 hr tablet  2. How are you currently taking this medication (dosage and times per day)? 25 mg   3. Are you having a reaction (difficulty breathing--STAT)? No   4. What is your medication issue? Patient on  amLODipine-olmesartan (AZOR) 10-40 MG tablet pharmacy saying both medication will cause issues with blood pressure

## 2016-06-13 NOTE — Telephone Encounter (Signed)
Patient took Toprol XL 25 mg twice a day yesterday, for about 30 minutes yesterday evening he felt lightheaded.  Advised to cut back to 1/2 tablet twice a day and explained the use of Toprol is for his palpitations.  Advised to check BP if has lightheaded feeling again and instructed to increase water intake.  Pt has appt Monday with Dr. Harrington Challenger.

## 2016-06-15 NOTE — Progress Notes (Signed)
Cardiology Office Note   Date:  06/16/2016   ID:  Christian Peters, DOB 09/12/1975, MRN [id number]  PCP:  Wende Neighbors, MD  Cardiologist:   Dorris Carnes, MD    F?U of CP and syncope     History of Present Illness: Christian Peters is a 40 y.o. male with a history of DM, HTN.  CP   CP sharp, stabbing  Also a hisotry of syncope I saw the pt in October   Felt pain was atypical  Set pt up for event monitor  Echo was normal  Myovue was normal  Event monitor without arrhythmia Since seen he has had one dizzy spell    Laying in bed  Sat up    Got ligtht headed  Not close to passing out  Het event monitor  He still notes skips  Isolated  Stil having CP  Constant with intermitt sharp component   Not associated with activity   No pleuritic    Outpatient Medications Prior to Visit  Medication Sig Dispense Refill  . amLODipine-olmesartan (AZOR) 10-40 MG tablet Take 1 tablet by mouth daily.   3  . aspirin EC 81 MG tablet Take 1 tablet (81 mg total) by mouth daily.    Marland Kitchen HYDROcodone-acetaminophen (NORCO/VICODIN) 5-325 MG tablet Take 1 tablet by mouth as needed.   0  . metoprolol succinate (TOPROL XL) 25 MG 24 hr tablet Take 1 tablet (25 mg total) by mouth 2 (two) times daily. 60 tablet 11  . metFORMIN (GLUCOPHAGE) 500 MG tablet Take 500 mg by mouth 2 (two) times daily with a meal.     No facility-administered medications prior to visit.      Allergies:   Patient has no known allergies.   Past Medical History:  Diagnosis Date  . Diabetes mellitus without complication ([place])   . Heart murmur   . Hypertension     Past Surgical History:  Procedure Laterality Date  . KNEE SURGERY Right    x4     Social History:  The patient  reports that he has never smoked. He has never used smokeless tobacco. He reports that he does not drink alcohol or use drugs.   Family History:  The patient's family history includes Cancer in his father and mother.    ROS:  Please see the history of present illness.  All other systems are reviewed and  Negative to the above problem except as noted.    PHYSICAL EXAM: VS:  BP 130/72   Pulse 78   Ht 6' 2"  (1.88 m)   Wt 274 lb 12.8 oz (124.6 kg)   SpO2 96%   BMI 35.28 kg/m   GEN: Well nourished, well developed, in no acute distress  HEENT: normal  Neck: no JVD, carotid bruits, or masses Cardiac: RRR; no murmurs, rubs, or gallops,no edema  Chest  Tender to palpation of L chest   Respiratory:  clear to auscultation bilaterally, normal work of breathing GI: soft, nontender, nondistended, + BS  No hepatomegaly  MS: no deformity Moving all extremities   Skin: warm and dry, no rash Neuro:  Strength and sensation are intact Psych: euthymic mood, full affect   EKG:  EKG is not  ordered today.   Lipid Panel No results found for: CHOL, TRIG, HDL, CHOLHDL, VLDL, LDLCALC, LDLDIRECT    Wt Readings from Last 3 Encounters:  06/16/16 274 lb 12.8 oz (124.6 kg)  05/01/16 284 lb (128.8 kg)  04/29/16 284 lb (128.8 kg)  ASSESSMENT AND PLAN:  1  CP  Appear to be musculoskel  He does have a shoulder injury on that side  Recomm rest  Can make appt wit hZ Tamala Julian to look at shoulder and chest  2  dizzienss / syncope  Follow  Stay hydrated  I think orthostatic in nature Keep up with training  3  Skips  Pt with isolated PVCs  Stay hydrated   He gatve up for caffeine  He can switch toprol to am dosing and follow  Email back response     Current medicines are reviewed at length with the patient today.  The patient does not have concerns regarding medicines.  Signed, Dorris Carnes, MD  06/16/2016 10:16 AM    [address], [place], [place]  [address] Phone: [phone]; Fax: [phone]

## 2016-06-16 ENCOUNTER — Encounter: Payer: Self-pay | Admitting: Internal Medicine

## 2016-06-16 ENCOUNTER — Ambulatory Visit (INDEPENDENT_AMBULATORY_CARE_PROVIDER_SITE_OTHER): Payer: BLUE CROSS/BLUE SHIELD | Admitting: Internal Medicine

## 2016-06-16 VITALS — BP 130/72 | HR 78 | Ht 74.0 in | Wt 274.8 lb

## 2016-06-16 DIAGNOSIS — R072 Precordial pain: Secondary | ICD-10-CM | POA: Diagnosis not present

## 2016-06-16 DIAGNOSIS — R002 Palpitations: Secondary | ICD-10-CM | POA: Diagnosis not present

## 2016-06-16 DIAGNOSIS — R55 Syncope and collapse: Secondary | ICD-10-CM

## 2016-06-16 MED ORDER — METOPROLOL SUCCINATE ER 25 MG PO TB24: 25.0000 mg | ORAL_TABLET | Freq: Every day | ORAL | 11 refills | Status: DC

## 2016-06-16 NOTE — Patient Instructions (Signed)
Your physician has recommended you make the following change in your medication:  1.) Toprol XL - take 25 mg every morning   Your physician recommends that you schedule a follow-up appointment in: 3-4 months with Dr. Harrington Challenger.

## 2016-06-16 NOTE — Progress Notes (Deleted)
  [facility] [address] [place], [place] [address] Phone: [phone] Subjective:    I'm seeing this patient by the request  of:  Dr. Harrington Challenger MD  CC: Chest pain and shoulder pain.  LEX:[id number]  Christian Peters is a 40 y.o. male coming in with complaint of chest pain and shoulder pain. Patient has recently been seen by a cardiologist with workup being unremarkable for cardiac pathology. Patient states     Past Medical History:  Diagnosis Date  . Diabetes mellitus without complication ([place])   . Heart murmur   . Hypertension    Past Surgical History:  Procedure Laterality Date  . KNEE SURGERY Right    x4   Social History   Social History  . Marital status: Single    Spouse name: N/A  . Number of children: N/A  . Years of education: N/A   Social History Main Topics  . Smoking status: Never Smoker  . Smokeless tobacco: Never Used  . Alcohol use No  . Drug use: No  . Sexual activity: Not on file   Other Topics Concern  . Not on file   Social History Narrative  . No narrative on file   No Known Allergies Family History  Problem Relation Age of Onset  . Cancer Mother   . Cancer Father     Past medical history, social, surgical and family history all reviewed in electronic medical record.  No pertanent information unless stated regarding to the chief complaint.   Review of Systems:Review of systems updated and as accurate as of 06/16/16  No headache, visual changes, nausea, vomiting, diarrhea, constipation, dizziness, abdominal pain, skin rash, fevers, chills, night sweats, weight loss, swollen lymph nodes, body aches, joint swelling, muscle aches, chest pain, shortness of breath, mood changes.   Objective  There were no vitals taken for this visit. Systems examined below as of 06/16/16   General: No apparent distress alert and oriented x3 mood and affect normal, dressed appropriately.  HEENT: Pupils equal, extraocular movements  intact  Respiratory: Patient's speak in full sentences and does not appear short of breath  Cardiovascular: No lower extremity edema, non tender, no erythema  Skin: Warm dry intact with no signs of infection or rash on extremities or on axial skeleton.  Abdomen: Soft nontender  Neuro: Cranial nerves II through XII are intact, neurovascularly intact in all extremities with 2+ DTRs and 2+ pulses.  Lymph: No lymphadenopathy of posterior or anterior cervical chain or axillae bilaterally.  Gait normal with good balance and coordination.  MSK:  Non tender with full range of motion and good stability and symmetric strength and tone of shoulders, elbows, wrist, hip, knee and ankles bilaterally.     Impression and Recommendations:     This case required medical decision making of moderate complexity.      Note: This dictation was prepared with Dragon dictation along with smaller phrase technology. Any transcriptional errors that result from this process are unintentional.

## 2016-06-17 ENCOUNTER — Ambulatory Visit: Payer: BLUE CROSS/BLUE SHIELD | Admitting: Family Medicine

## 2016-07-11 ENCOUNTER — Telehealth: Payer: Self-pay

## 2016-07-11 NOTE — Telephone Encounter (Signed)
Patient walked in to office requesting a doctor's note to return to work on January 3rd. Patient will be working at [employer] with [employer]. Informed patient that Dr. Harrington Challenger is not in the office today, but we would send his request to her. Patient verbalized understanding and will await a call back.

## 2016-07-15 ENCOUNTER — Encounter: Payer: Self-pay | Admitting: Internal Medicine

## 2016-07-15 NOTE — Telephone Encounter (Signed)
Patient needs a letter to return back to work tomorrow. Paged Dr. Harrington Challenger. Will also forward to Dr. Harrington Challenger.

## 2016-07-15 NOTE — Telephone Encounter (Signed)
Patient is OK to return to work with activites as tolerated. Please give as note for excudse

## 2016-07-16 NOTE — Telephone Encounter (Signed)
Called patient and let him know that he could return to work today with activities as tolerated. Sent letter through [employer]. Patient verbalized understanding and thanked our office.

## 2016-10-06 ENCOUNTER — Ambulatory Visit: Payer: BLUE CROSS/BLUE SHIELD | Admitting: Internal Medicine

## 2016-10-28 ENCOUNTER — Other Ambulatory Visit (HOSPITAL_COMMUNITY): Payer: Self-pay | Admitting: Adult Health Nurse Practitioner

## 2016-10-28 ENCOUNTER — Ambulatory Visit (HOSPITAL_COMMUNITY)
Admission: RE | Admit: 2016-10-28 | Discharge: 2016-10-28 | Disposition: A | Discharge status: Home / Self Care | Payer: BLUE CROSS/BLUE SHIELD | Source: Ambulatory Visit | Attending: Adult Health Nurse Practitioner | Admitting: Adult Health Nurse Practitioner

## 2016-10-28 DIAGNOSIS — M545 Low back pain: Secondary | ICD-10-CM | POA: Diagnosis present

## 2016-10-28 DIAGNOSIS — M4316 Spondylolisthesis, lumbar region: Secondary | ICD-10-CM | POA: Diagnosis not present

## 2017-09-10 DIAGNOSIS — H9201 Otalgia, right ear: Secondary | ICD-10-CM | POA: Diagnosis not present

## 2017-11-24 DIAGNOSIS — E782 Mixed hyperlipidemia: Secondary | ICD-10-CM | POA: Diagnosis not present

## 2017-11-24 DIAGNOSIS — E119 Type 2 diabetes mellitus without complications: Secondary | ICD-10-CM | POA: Diagnosis not present

## 2017-11-27 DIAGNOSIS — Z0000 Encounter for general adult medical examination without abnormal findings: Secondary | ICD-10-CM | POA: Diagnosis not present

## 2017-11-27 DIAGNOSIS — I10 Essential (primary) hypertension: Secondary | ICD-10-CM | POA: Diagnosis not present

## 2017-11-27 DIAGNOSIS — E119 Type 2 diabetes mellitus without complications: Secondary | ICD-10-CM | POA: Diagnosis not present

## 2017-12-16 DIAGNOSIS — M25312 Other instability, left shoulder: Secondary | ICD-10-CM | POA: Diagnosis not present

## 2017-12-16 DIAGNOSIS — S43432D Superior glenoid labrum lesion of left shoulder, subsequent encounter: Secondary | ICD-10-CM | POA: Diagnosis not present

## 2017-12-16 DIAGNOSIS — M75112 Incomplete rotator cuff tear or rupture of left shoulder, not specified as traumatic: Secondary | ICD-10-CM | POA: Diagnosis not present

## 2018-01-05 DIAGNOSIS — S43432A Superior glenoid labrum lesion of left shoulder, initial encounter: Secondary | ICD-10-CM | POA: Diagnosis not present

## 2018-01-05 DIAGNOSIS — M19012 Primary osteoarthritis, left shoulder: Secondary | ICD-10-CM | POA: Diagnosis not present

## 2018-01-05 DIAGNOSIS — M7502 Adhesive capsulitis of left shoulder: Secondary | ICD-10-CM | POA: Diagnosis not present

## 2018-01-05 DIAGNOSIS — M7542 Impingement syndrome of left shoulder: Secondary | ICD-10-CM | POA: Diagnosis not present

## 2018-01-05 DIAGNOSIS — G8918 Other acute postprocedural pain: Secondary | ICD-10-CM | POA: Diagnosis not present

## 2018-01-06 DIAGNOSIS — M7502 Adhesive capsulitis of left shoulder: Secondary | ICD-10-CM | POA: Diagnosis not present

## 2018-01-07 DIAGNOSIS — M7502 Adhesive capsulitis of left shoulder: Secondary | ICD-10-CM | POA: Diagnosis not present

## 2018-01-08 DIAGNOSIS — M7502 Adhesive capsulitis of left shoulder: Secondary | ICD-10-CM | POA: Diagnosis not present

## 2018-01-11 DIAGNOSIS — M7502 Adhesive capsulitis of left shoulder: Secondary | ICD-10-CM | POA: Diagnosis not present

## 2018-01-13 DIAGNOSIS — Z4789 Encounter for other orthopedic aftercare: Secondary | ICD-10-CM | POA: Diagnosis not present

## 2018-01-15 DIAGNOSIS — M7502 Adhesive capsulitis of left shoulder: Secondary | ICD-10-CM | POA: Diagnosis not present

## 2018-01-18 DIAGNOSIS — M7502 Adhesive capsulitis of left shoulder: Secondary | ICD-10-CM | POA: Diagnosis not present

## 2018-01-20 DIAGNOSIS — M7502 Adhesive capsulitis of left shoulder: Secondary | ICD-10-CM | POA: Diagnosis not present

## 2018-01-21 DIAGNOSIS — M7502 Adhesive capsulitis of left shoulder: Secondary | ICD-10-CM | POA: Diagnosis not present

## 2018-01-25 DIAGNOSIS — M7502 Adhesive capsulitis of left shoulder: Secondary | ICD-10-CM | POA: Diagnosis not present

## 2018-01-26 DIAGNOSIS — M7502 Adhesive capsulitis of left shoulder: Secondary | ICD-10-CM | POA: Diagnosis not present

## 2018-02-03 DIAGNOSIS — M25511 Pain in right shoulder: Secondary | ICD-10-CM | POA: Diagnosis not present

## 2018-02-04 DIAGNOSIS — M7502 Adhesive capsulitis of left shoulder: Secondary | ICD-10-CM | POA: Diagnosis not present

## 2018-02-08 DIAGNOSIS — M7502 Adhesive capsulitis of left shoulder: Secondary | ICD-10-CM | POA: Diagnosis not present

## 2018-02-10 DIAGNOSIS — M25511 Pain in right shoulder: Secondary | ICD-10-CM | POA: Diagnosis not present

## 2018-02-10 DIAGNOSIS — M7502 Adhesive capsulitis of left shoulder: Secondary | ICD-10-CM | POA: Diagnosis not present

## 2018-02-12 DIAGNOSIS — M25511 Pain in right shoulder: Secondary | ICD-10-CM | POA: Diagnosis not present

## 2018-02-12 DIAGNOSIS — K0889 Other specified disorders of teeth and supporting structures: Secondary | ICD-10-CM | POA: Diagnosis not present

## 2018-02-12 DIAGNOSIS — G501 Atypical facial pain: Secondary | ICD-10-CM | POA: Diagnosis not present

## 2018-02-15 ENCOUNTER — Other Ambulatory Visit: Payer: Self-pay | Admitting: Orthopedic Surgery

## 2018-02-15 DIAGNOSIS — M25511 Pain in right shoulder: Secondary | ICD-10-CM

## 2018-02-23 DIAGNOSIS — M7502 Adhesive capsulitis of left shoulder: Secondary | ICD-10-CM | POA: Diagnosis not present

## 2018-02-25 DIAGNOSIS — M7502 Adhesive capsulitis of left shoulder: Secondary | ICD-10-CM | POA: Diagnosis not present

## 2018-03-18 DIAGNOSIS — M7502 Adhesive capsulitis of left shoulder: Secondary | ICD-10-CM | POA: Diagnosis not present

## 2018-03-26 DIAGNOSIS — M7502 Adhesive capsulitis of left shoulder: Secondary | ICD-10-CM | POA: Diagnosis not present

## 2018-04-07 DIAGNOSIS — M7502 Adhesive capsulitis of left shoulder: Secondary | ICD-10-CM | POA: Diagnosis not present

## 2018-04-07 DIAGNOSIS — M25512 Pain in left shoulder: Secondary | ICD-10-CM | POA: Diagnosis not present

## 2018-04-24 DIAGNOSIS — H6501 Acute serous otitis media, right ear: Secondary | ICD-10-CM | POA: Diagnosis not present

## 2018-04-24 DIAGNOSIS — Z6841 Body Mass Index (BMI) 40.0 and over, adult: Secondary | ICD-10-CM | POA: Diagnosis not present

## 2018-04-29 DIAGNOSIS — G8918 Other acute postprocedural pain: Secondary | ICD-10-CM | POA: Diagnosis not present

## 2018-04-29 DIAGNOSIS — S43492A Other sprain of left shoulder joint, initial encounter: Secondary | ICD-10-CM | POA: Diagnosis not present

## 2018-04-29 DIAGNOSIS — M7502 Adhesive capsulitis of left shoulder: Secondary | ICD-10-CM | POA: Diagnosis not present

## 2018-04-29 HISTORY — PX: SHOULDER SURGERY: SHX246

## 2018-04-30 ENCOUNTER — Emergency Department (HOSPITAL_COMMUNITY): Payer: 59

## 2018-04-30 ENCOUNTER — Emergency Department (HOSPITAL_COMMUNITY)
Admission: EM | Admit: 2018-04-30 | Discharge: 2018-04-30 | Disposition: A | Discharge status: Home / Self Care | Payer: 59 | Attending: Emergency Medicine | Admitting: Emergency Medicine

## 2018-04-30 ENCOUNTER — Other Ambulatory Visit: Payer: Self-pay

## 2018-04-30 ENCOUNTER — Encounter (HOSPITAL_COMMUNITY): Payer: Self-pay | Admitting: Emergency Medicine

## 2018-04-30 DIAGNOSIS — E119 Type 2 diabetes mellitus without complications: Secondary | ICD-10-CM | POA: Diagnosis not present

## 2018-04-30 DIAGNOSIS — Z7982 Long term (current) use of aspirin: Secondary | ICD-10-CM | POA: Insufficient documentation

## 2018-04-30 DIAGNOSIS — I10 Essential (primary) hypertension: Secondary | ICD-10-CM | POA: Insufficient documentation

## 2018-04-30 DIAGNOSIS — R0789 Other chest pain: Secondary | ICD-10-CM | POA: Diagnosis not present

## 2018-04-30 DIAGNOSIS — Z79899 Other long term (current) drug therapy: Secondary | ICD-10-CM | POA: Diagnosis not present

## 2018-04-30 DIAGNOSIS — R0602 Shortness of breath: Secondary | ICD-10-CM

## 2018-04-30 DIAGNOSIS — M7502 Adhesive capsulitis of left shoulder: Secondary | ICD-10-CM | POA: Diagnosis not present

## 2018-04-30 LAB — POCT I-STAT TROPONIN I: TROPONIN I, POC: 0 ng/mL (ref 0.00–0.08)

## 2018-04-30 LAB — POCT I-STAT, CHEM 8
BUN: 20 mg/dL (ref 6–20)
CHLORIDE: 106 mmol/L (ref 98–111)
CREATININE: 1 mg/dL (ref 0.61–1.24)
Calcium, Ion: 1.16 mmol/L (ref 1.15–1.40)
GLUCOSE: 133 mg/dL — AB (ref 70–99)
HCT: 46 % (ref 39.0–52.0)
Hemoglobin: 15.6 g/dL (ref 13.0–17.0)
POTASSIUM: 3.6 mmol/L (ref 3.5–5.1)
Sodium: 140 mmol/L (ref 135–145)
TCO2: 22 mmol/L (ref 22–32)

## 2018-04-30 MED ORDER — IOPAMIDOL (ISOVUE-370) INJECTION 76%
100.0000 mL | Freq: Once | INTRAVENOUS | Status: AC | PRN
  Administered 2018-04-30: 100 mL via INTRAVENOUS

## 2018-04-30 MED ORDER — IPRATROPIUM-ALBUTEROL 0.5-2.5 (3) MG/3ML IN SOLN
3.0000 mL | Freq: Once | RESPIRATORY_TRACT | Status: AC
  Administered 2018-04-30: 3 mL via RESPIRATORY_TRACT
  Filled 2018-04-30: qty 3

## 2018-04-30 MED ORDER — ALBUTEROL SULFATE HFA 108 (90 BASE) MCG/ACT IN AERS
2.0000 | INHALATION_SPRAY | Freq: Four times a day (QID) | RESPIRATORY_TRACT | Status: DC
  Administered 2018-04-30: 2 via RESPIRATORY_TRACT
  Filled 2018-04-30: qty 6.7

## 2018-04-30 NOTE — Discharge Instructions (Addendum)
CT angios of the chest showed no evidence of any blood clots in the lungs.  Showed no significant lung abnormalities.  Your heart marker was negative no evidence of any acute heart problem.  Would recommend using the albuterol inhaler 2 puffs every 6 hours for the next 7 days.  Return for any new or worse symptoms.

## 2018-04-30 NOTE — ED Triage Notes (Signed)
SOB and chest tightness since last night.  Had surgery on lt shoulder yesterday with a nerve block.

## 2018-04-30 NOTE — ED Provider Notes (Addendum)
[facility] EMERGENCY DEPARTMENT Provider Note   CSN: [id number] Arrival date & time: 04/30/18  1834     History   Chief Complaint Chief Complaint  Patient presents with  . Shortness of Breath    HPI Christian Peters is a 42 y.o. male.  Patient status post shoulder surgery yesterday.  Had surgery to his left shoulder.  He was revision of previous surgery.  It was done by Dr. Theda Sers of [facility].  Patient had a left shoulder block had a nerve block to do it.  He is not certain whether he was intubated or not.  Patient really denies any chest pain although that was mentioned in triage he started to feel as if he was having shortness of breath and is somewhat exertional and this started last night.  Oxygen saturation on arrival was 96%.  But this was at rest.  Patient denied any chest pain but did describe some chest tightness or discomfort that started last evening and has been present since that time.     Past Medical History:  Diagnosis Date  . Diabetes mellitus without complication ([place])   . Heart murmur   . Hypertension     Patient Active Problem List   Diagnosis Date Noted  . Syncope and collapse 04/12/2016  . Nonspecific chest pain 04/12/2016  . Essential hypertension   . Diabetes mellitus without complication [facility])     Past Surgical History:  Procedure Laterality Date  . KNEE SURGERY Right    x4  . SHOULDER SURGERY Left 04/29/2018        Home Medications    Prior to Admission medications   Medication Sig Start Date End Date Taking? Authorizing Provider  amLODipine-olmesartan (AZOR) 10-40 MG tablet Take 1 tablet by mouth daily.  04/10/16   [provider]  aspirin EC 81 MG tablet Take 1 tablet (81 mg total) by mouth daily. 04/13/16   Samuella Cota, MD  HYDROcodone-acetaminophen (NORCO/VICODIN) 5-325 MG tablet Take 1 tablet by mouth as needed.  04/30/16   [provider]  metoprolol succinate (TOPROL XL) 25 MG 24 hr tablet  Take 1 tablet (25 mg total) by mouth daily. 06/16/16   Fay Records, MD    Family History Family History  Problem Relation Age of Onset  . Cancer Mother   . Cancer Father     Social History Social History   Tobacco Use  . Smoking status: Never Smoker  . Smokeless tobacco: Never Used  Substance Use Topics  . Alcohol use: No  . Drug use: No     Allergies   Patient has no known allergies.   Review of Systems Review of Systems  Constitutional: Negative for fever.  HENT: Negative for congestion.   Eyes: Negative for redness.  Respiratory: Positive for chest tightness and shortness of breath.   Cardiovascular: Negative for chest pain and leg swelling.  Gastrointestinal: Negative for abdominal pain.  Genitourinary: Negative for dysuria.  Musculoskeletal: Negative for back pain.  Skin: Negative for rash.  Neurological: Negative for syncope.  Hematological: Does not bruise/bleed easily.  Psychiatric/Behavioral: Negative for confusion.     Physical Exam Updated Vital Signs BP 125/80   Pulse 90   Temp 98.3 F (36.8 C) (Oral)   Resp (!) 22   Ht 1.88 m (6' 2" )   Wt 134.7 kg   SpO2 95%   BMI 38.13 kg/m   Physical Exam  Constitutional: He is oriented to person, place, and time. He appears  well-developed and well-nourished. No distress.  HENT:  Head: Normocephalic and atraumatic.  Mouth/Throat: Oropharynx is clear and moist.  Eyes: Pupils are equal, round, and reactive to light. Conjunctivae and EOM are normal.  Neck: Neck supple.  Cardiovascular: Normal rate, regular rhythm and normal heart sounds.  Pulmonary/Chest: Effort normal and breath sounds normal. No respiratory distress. He has no wheezes. He has no rales.  Abdominal: Soft. There is no tenderness.  Musculoskeletal:  Left shoulder with some surgical surgical scars.  Distally radial pulses intact no significant arm swelling.  Neurological: He is alert and oriented to person, place, and time. No cranial  nerve deficit or sensory deficit. He exhibits normal muscle tone. Coordination normal.  Skin: Skin is warm.  Nursing note and vitals reviewed.    ED Treatments / Results  Labs (all labs ordered are listed, but only abnormal results are displayed) Labs Reviewed  POCT I-STAT, CHEM 8 - Abnormal; Notable for the following components:      Result Value   Glucose, Bld 133 (*)    All other components within normal limits  I-STAT CHEM 8, ED  I-STAT TROPONIN, ED  POCT I-STAT TROPONIN I   Results for orders placed or performed during the hospital encounter of 04/30/18  I-STAT, chem 8  Result Value Ref Range   Sodium 140 135 - 145 mmol/L   Potassium 3.6 3.5 - 5.1 mmol/L   Chloride 106 98 - 111 mmol/L   BUN 20 6 - 20 mg/dL   Creatinine, Ser 1.00 0.61 - 1.24 mg/dL   Glucose, Bld 133 (H) 70 - 99 mg/dL   Calcium, Ion 1.16 1.15 - 1.40 mmol/L   TCO2 22 22 - 32 mmol/L   Hemoglobin 15.6 13.0 - 17.0 g/dL   HCT 46.0 39.0 - 52.0 %  POCT i-Stat troponin I  Result Value Ref Range   Troponin i, poc 0.00 0.00 - 0.08 ng/mL   Comment 3             EKG EKG Interpretation  Date/Time:  Friday April 30 2018 18:44:04 EDT Ventricular Rate:  93 PR Interval:  152 QRS Duration: 102 QT Interval:  362 QTC Calculation: 450 R Axis:   7 Text Interpretation:  Normal sinus rhythm Normal ECG similar to prior 10/17 Confirmed by Aletta Edouard [phone]) on 04/30/2018 7:15:40 PM Also confirmed by Fredia Sorrow [phone])  on 04/30/2018 7:42:35 PM   Radiology Dg Chest 2 View  Result Date: 04/30/2018 CLINICAL DATA:  Shortness of breath. EXAM: CHEST - 2 VIEW COMPARISON:  Radiographs of April 29, 2016. FINDINGS: The heart size and mediastinal contours are within normal limits. No pneumothorax or pleural effusion is noted. Right lung is clear. Mild left basilar subsegmental atelectasis or scarring is noted. The visualized skeletal structures are unremarkable. IMPRESSION: Mild left basilar subsegmental  atelectasis or scarring. Electronically Signed   By: Marijo Conception, M.D.   On: 04/30/2018 19:39    Procedures Procedures (including critical care time)  Medications Ordered in ED Medications  iopamidol (ISOVUE-370) 76 % injection 100 mL (has no administration in time range)  ipratropium-albuterol (DUONEB) 0.5-2.5 (3) MG/3ML nebulizer solution 3 mL (3 mLs Nebulization Given 04/30/18 2025)     Initial Impression / Assessment and Plan / ED Course  I have reviewed the triage vital signs and the nursing notes.  Pertinent labs & imaging results that were available during my care of the patient were reviewed by me and considered in my medical decision making (see chart for  details).    Patient's exertional shortness of breath obviously a concern for pulmonary embolus.  Troponin was negative chest x-ray negative no evidence of pneumonia.  CT angios ordered.  Although patient not hypoxic here certainly his symptoms are concerning for that.  If CT angios negative patient is safe for discharge home.  EKG had no acute findings.  He was not tachycardic on EKG.  Patient symptoms started last evening.  Patient is never had symptoms like this before.  On lung exam there was no wheezing.  We did give patient a trial of a nebulizer to see if it made his breathing feel any better.  Patient with the chest tightness as described in the history of present illness.  He felt that that improved some after the nebulizer also he was able to move air better after the nebulizer treatment.  Patient is now going to CT scan.  It is reassuring the patient's troponin is negative despite all the symptoms starting last evening.  Do not feel that this is an acute cardiac event.  Still have significant concerns for possible pulmonary embolus.   Final Clinical Impressions(s) / ED Diagnoses   Final diagnoses:  SOB (shortness of breath)    ED Discharge Orders    None       Fredia Sorrow, MD 04/30/18 2133      Fredia Sorrow, MD 04/30/18 2139   CT angios of the chest without evidence of a pulmonary embolus.  No other acute pulmonary findings.  Patient with some improvement with albuterol nebulizer here we will have him use albuterol inhaler at home.   Fredia Sorrow, MD 04/30/18 2215

## 2018-05-04 DIAGNOSIS — M7502 Adhesive capsulitis of left shoulder: Secondary | ICD-10-CM | POA: Diagnosis not present

## 2018-06-15 DIAGNOSIS — M7502 Adhesive capsulitis of left shoulder: Secondary | ICD-10-CM | POA: Diagnosis not present

## 2018-06-21 DIAGNOSIS — M7502 Adhesive capsulitis of left shoulder: Secondary | ICD-10-CM | POA: Diagnosis not present

## 2018-06-24 DIAGNOSIS — M7502 Adhesive capsulitis of left shoulder: Secondary | ICD-10-CM | POA: Diagnosis not present

## 2018-06-28 DIAGNOSIS — M7502 Adhesive capsulitis of left shoulder: Secondary | ICD-10-CM | POA: Diagnosis not present

## 2018-06-30 DIAGNOSIS — M7502 Adhesive capsulitis of left shoulder: Secondary | ICD-10-CM | POA: Diagnosis not present

## 2018-07-26 DIAGNOSIS — M7502 Adhesive capsulitis of left shoulder: Secondary | ICD-10-CM | POA: Diagnosis not present

## 2018-07-27 DIAGNOSIS — M7502 Adhesive capsulitis of left shoulder: Secondary | ICD-10-CM | POA: Diagnosis not present

## 2018-07-30 DIAGNOSIS — M7502 Adhesive capsulitis of left shoulder: Secondary | ICD-10-CM | POA: Diagnosis not present

## 2018-08-02 DIAGNOSIS — E119 Type 2 diabetes mellitus without complications: Secondary | ICD-10-CM | POA: Diagnosis not present

## 2018-08-02 DIAGNOSIS — H9201 Otalgia, right ear: Secondary | ICD-10-CM | POA: Diagnosis not present

## 2018-08-02 DIAGNOSIS — G501 Atypical facial pain: Secondary | ICD-10-CM | POA: Diagnosis not present

## 2018-08-02 DIAGNOSIS — I10 Essential (primary) hypertension: Secondary | ICD-10-CM | POA: Diagnosis not present

## 2018-08-02 DIAGNOSIS — K0889 Other specified disorders of teeth and supporting structures: Secondary | ICD-10-CM | POA: Diagnosis not present

## 2018-08-06 DIAGNOSIS — I10 Essential (primary) hypertension: Secondary | ICD-10-CM | POA: Diagnosis not present

## 2018-08-06 DIAGNOSIS — R945 Abnormal results of liver function studies: Secondary | ICD-10-CM | POA: Diagnosis not present

## 2018-08-06 DIAGNOSIS — E119 Type 2 diabetes mellitus without complications: Secondary | ICD-10-CM | POA: Diagnosis not present

## 2018-08-06 DIAGNOSIS — M7502 Adhesive capsulitis of left shoulder: Secondary | ICD-10-CM | POA: Diagnosis not present

## 2018-08-16 DIAGNOSIS — M7502 Adhesive capsulitis of left shoulder: Secondary | ICD-10-CM | POA: Diagnosis not present

## 2018-08-18 DIAGNOSIS — M25512 Pain in left shoulder: Secondary | ICD-10-CM | POA: Diagnosis not present

## 2018-08-31 DIAGNOSIS — M25512 Pain in left shoulder: Secondary | ICD-10-CM | POA: Diagnosis not present

## 2018-08-31 DIAGNOSIS — M7502 Adhesive capsulitis of left shoulder: Secondary | ICD-10-CM | POA: Diagnosis not present

## 2018-09-03 DIAGNOSIS — E1169 Type 2 diabetes mellitus with other specified complication: Secondary | ICD-10-CM | POA: Diagnosis not present

## 2018-09-03 DIAGNOSIS — I10 Essential (primary) hypertension: Secondary | ICD-10-CM | POA: Diagnosis not present

## 2018-09-10 DIAGNOSIS — M19012 Primary osteoarthritis, left shoulder: Secondary | ICD-10-CM | POA: Diagnosis not present

## 2018-09-28 DIAGNOSIS — M25512 Pain in left shoulder: Secondary | ICD-10-CM | POA: Diagnosis not present

## 2018-10-26 DIAGNOSIS — Z4789 Encounter for other orthopedic aftercare: Secondary | ICD-10-CM | POA: Diagnosis not present

## 2018-10-26 DIAGNOSIS — M25512 Pain in left shoulder: Secondary | ICD-10-CM | POA: Diagnosis not present

## 2018-11-30 DIAGNOSIS — E119 Type 2 diabetes mellitus without complications: Secondary | ICD-10-CM | POA: Diagnosis not present

## 2018-11-30 DIAGNOSIS — I10 Essential (primary) hypertension: Secondary | ICD-10-CM | POA: Diagnosis not present

## 2018-12-03 DIAGNOSIS — D696 Thrombocytopenia, unspecified: Secondary | ICD-10-CM | POA: Diagnosis not present

## 2018-12-03 DIAGNOSIS — E1169 Type 2 diabetes mellitus with other specified complication: Secondary | ICD-10-CM | POA: Diagnosis not present

## 2018-12-03 DIAGNOSIS — I10 Essential (primary) hypertension: Secondary | ICD-10-CM | POA: Diagnosis not present

## 2019-10-10 ENCOUNTER — Ambulatory Visit: Payer: 59 | Attending: Internal Medicine

## 2019-10-10 DIAGNOSIS — Z23 Encounter for immunization: Secondary | ICD-10-CM

## 2019-10-10 NOTE — Progress Notes (Signed)
   Covid-19 Vaccination Clinic  Name:  Christian Peters    MRN: [id number] DOB: 20-Dec-1975  10/10/2019  Christian Peters was observed post Covid-19 immunization for 15 minutes without incident. He was provided with Vaccine Information Sheet and instruction to access the V-Safe system.   Christian Peters was instructed to call 911 with any severe reactions post vaccine: Marland Kitchen Difficulty breathing  . Swelling of face and throat  . A fast heartbeat  . A bad rash all over body  . Dizziness and weakness   Immunizations Administered    Name Date Dose VIS Date Route   Pfizer COVID-19 Vaccine 10/10/2019  2:53 PM 0.3 mL 06/24/2019 Intramuscular   Manufacturer: [place]   Lot: [id number]   [place]: [id number]

## 2019-10-20 ENCOUNTER — Encounter: Payer: Self-pay | Admitting: Plastic Surgery

## 2019-10-20 ENCOUNTER — Other Ambulatory Visit: Payer: Self-pay

## 2019-10-20 ENCOUNTER — Ambulatory Visit (INDEPENDENT_AMBULATORY_CARE_PROVIDER_SITE_OTHER): Payer: No Typology Code available for payment source | Admitting: Plastic Surgery

## 2019-10-20 VITALS — BP 146/99 | HR 79 | Temp 97.8°F | Ht 74.0 in | Wt 305.0 lb

## 2019-10-20 DIAGNOSIS — L989 Disorder of the skin and subcutaneous tissue, unspecified: Secondary | ICD-10-CM

## 2019-10-20 NOTE — Progress Notes (Signed)
   Referring Provider Celene Squibb, MD [address] [place],  [place] [address]   CC:  Chief Complaint  Patient presents with  . Advice Only    skin tag of the rigjt eye      Christian Peters is an 44 y.o. male.  HPI: Patient presents complaining of 3 skin tags on the right upper eyelid.  They been present for a number of years.  They have grown to the point where they well partially obstruct his visual field superiorly.  He is bothered by their appearance.  He is interested in having them removed.  He has no other bothersome skin lesions.  No Known Allergies  Outpatient Encounter Medications as of 10/20/2019  Medication Sig Note  . amLODipine-olmesartan (AZOR) 10-40 MG tablet Take 1 tablet by mouth daily.  04/12/2016: Received from: External Pharmacy  . aspirin EC 81 MG tablet Take 1 tablet (81 mg total) by mouth daily.   Marland Kitchen HYDROcodone-acetaminophen (NORCO/VICODIN) 5-325 MG tablet Take 1 tablet by mouth as needed.  05/01/2016: Received from: External Pharmacy  . metFORMIN (GLUCOPHAGE) 500 MG tablet metformin 500 mg tablet   . metoprolol succinate (TOPROL XL) 25 MG 24 hr tablet Take 1 tablet (25 mg total) by mouth daily.    No facility-administered encounter medications on file as of 10/20/2019.     Past Medical History:  Diagnosis Date  . Diabetes mellitus without complication ([place])   . Heart murmur   . Hypertension     Past Surgical History:  Procedure Laterality Date  . KNEE SURGERY Right    x4  . SHOULDER SURGERY Left 04/29/2018    Family History  Problem Relation Age of Onset  . Cancer Mother   . Cancer Father     Social History   Social History Narrative  . Not on file     Review of Systems General: Denies fevers, chills, weight loss CV: Denies chest pain, shortness of breath, palpitations  Physical Exam Vitals with BMI 10/20/2019 04/30/2018 04/30/2018  Height 6' 2"  - -  Weight 305 lbs - -  BMI 42.70 - -  Systolic 623 762 -  Diastolic 99 97 -  Pulse  79 87 90    General:  No acute distress,  Alert and oriented, Non-Toxic, Normal speech and affect HEENT: Normocephalic atraumatic.  Extraocular is intact.  Cranial nerves grossly intact.  He has 3 benign-appearing skin tags in the right upper lid that are close to the eyelid margin.  One of them looks to hang over the lid margin a bit.  Assessment/Plan Patient presents with symptomatic skin tags in the right upper eyelid.  I recommended excision.  We discussed the risks and benefits that include bleeding, infection, damage surrounding structures, need for additional procedures.  We will plan to set this up and do it under local.  All of his questions were answered.  Cindra Presume 10/20/2019, 8:58 AM

## 2019-11-02 ENCOUNTER — Ambulatory Visit: Payer: No Typology Code available for payment source | Attending: Internal Medicine

## 2019-11-02 DIAGNOSIS — Z23 Encounter for immunization: Secondary | ICD-10-CM

## 2019-11-02 NOTE — Progress Notes (Signed)
   Covid-19 Vaccination Clinic  Name:  RUNE MENDEZ    MRN: [id number] DOB: [birth year]  11/02/2019  Mr. Moodie was observed post Covid-19 immunization for 15 minutes without incident. He was provided with Vaccine Information Sheet and instruction to access the V-Safe system.   Mr. Rebstock was instructed to call 911 with any severe reactions post vaccine: Marland Kitchen Difficulty breathing  . Swelling of face and throat  . A fast heartbeat  . A bad rash all over body  . Dizziness and weakness   Immunizations Administered    Name Date Dose VIS Date Route   Pfizer COVID-19 Vaccine 11/02/2019 10:27 AM 0.3 mL 09/07/2018 Intramuscular   Manufacturer: [place]   Lot: [id number]   [place]: [id number]

## 2019-11-17 ENCOUNTER — Other Ambulatory Visit: Payer: Self-pay

## 2019-11-17 ENCOUNTER — Ambulatory Visit (INDEPENDENT_AMBULATORY_CARE_PROVIDER_SITE_OTHER): Payer: No Typology Code available for payment source | Admitting: Plastic Surgery

## 2019-11-17 ENCOUNTER — Encounter: Payer: Self-pay | Admitting: Plastic Surgery

## 2019-11-17 VITALS — BP 149/89 | HR 76 | Temp 97.8°F | Ht 74.0 in | Wt 305.0 lb

## 2019-11-17 DIAGNOSIS — L918 Other hypertrophic disorders of the skin: Secondary | ICD-10-CM | POA: Diagnosis not present

## 2019-11-17 NOTE — Progress Notes (Signed)
Operative Note   DATE OF OPERATION: 11/17/2019  LOCATION:    SURGICAL DEPARTMENT: Plastic Surgery  PREOPERATIVE DIAGNOSES:  Right upper eyelid skin tags  POSTOPERATIVE DIAGNOSES:  same  PROCEDURE:  1. Excision of right upper eyelid skin tags x3 measuring 61m each  SURGEON: CTalmadge Coventry MD  ANESTHESIA:  Local  COMPLICATIONS: None.   INDICATIONS FOR PROCEDURE:  The patient, Christian Nilssonis a 44y.o. male born on 907-Sep-1977 is here for treatment of right upper lid skin tags. MRN: [id number] CONSENT:  Informed consent was obtained directly from the patient. Risks, benefits and alternatives were fully discussed. Specific risks including but not limited to bleeding, infection, hematoma, seroma, scarring, pain, infection, wound healing problems, and need for further surgery were all discussed. The patient did have an ample opportunity to have questions answered to satisfaction.   DESCRIPTION OF PROCEDURE:  Local anesthesia was administered. The patient's operative site was prepped and draped in a sterile fashion. A time out was performed and all information was confirmed to be correct.  The lesions were excised with pickups and scissors.  Hemostasis was obtained. Ointment was applied.  The patient tolerated the procedure well.  There were no complications.

## 2019-11-21 ENCOUNTER — Ambulatory Visit (INDEPENDENT_AMBULATORY_CARE_PROVIDER_SITE_OTHER): Payer: No Typology Code available for payment source | Admitting: Surgical

## 2019-11-21 ENCOUNTER — Telehealth: Payer: Self-pay | Admitting: Plastic Surgery

## 2019-11-21 ENCOUNTER — Encounter: Payer: Self-pay | Admitting: Surgical

## 2019-11-21 ENCOUNTER — Other Ambulatory Visit: Payer: Self-pay

## 2019-11-21 VITALS — BP 129/83 | HR 73 | Temp 98.2°F | Ht 74.0 in | Wt 305.0 lb

## 2019-11-21 DIAGNOSIS — L918 Other hypertrophic disorders of the skin: Secondary | ICD-10-CM

## 2019-11-21 NOTE — Progress Notes (Signed)
Patient is a 44 year old male here for follow-up after excision of right upper eyelid skin tags x3 measuring 2 mm each on 11/17/2019 with Dr. Claudia Desanctis.  He presents today for concerns of right upper eyelid swelling and clear drainage from right eye.  He reports that late last week he had difficulty opening his right eye, but this has improved.  He has been taking ibuprofen every 6 hours.  He reports his vision is intact other than some slight disturbance to his right vision due to the redundant tissue of the right eyelid.  He does not have any fevers or chills or nausea or vomiting.  He reports the drainage from his right eye is clear, it does not appear yellow to patient.  On exam today right eye is PERRL, no drainage noted from right eye, normal sclera without any conjunctivitis or redness.  Normal EOMs bilaterally.  Redundant tissue noted bilaterally, right greater than left due to some swelling.  I did not palpate any large fluid collections or fluctuance or purulence.  No erythema.  Visual acuity grossly intact.  Recommend continuing with ibuprofen every 6 hours for 1 week to assist with decreased swelling and inflammation.  I do not see any sign of infection or any concern for any other complications.  Patient's procedure was superficial other than injectable lidocaine for numbing prior to excision.  2-week follow-up scheduled, informed patient that if he is doing well he can cancel that appointment.  Please call with any questions or concerns or if symptoms change or worsen.

## 2019-11-28 NOTE — Progress Notes (Deleted)
Patient is a 44 year old male here for follow-up with excision of right upper eyelid skin tags x3 measuring 2 mm each on 11/17/2019 with Dr. Claudia Desanctis.  At his last visit patient was concerned due to right upper eyelid swelling and clear drainage of right eye.

## 2019-11-29 ENCOUNTER — Ambulatory Visit: Payer: No Typology Code available for payment source | Admitting: Surgical

## 2020-07-26 IMAGING — CT CT ANGIO CHEST
2 of 6 series · 18 of 46 positions shown · IV contrast (Isovue)
Comparison: 04/29/2016 CTA chest.

CLINICAL DATA: 42 y/o M; shortness of breath and chest tightness
since last night. PE suspected, high pretest prob.

EXAM:
CT ANGIOGRAPHY CHEST WITH CONTRAST
TECHNIQUE: Multidetector CT imaging of the chest was performed using the
standard protocol during bolus administration of intravenous
contrast. Multiplanar CT image reconstructions and MIPs were
obtained to evaluate the vascular anatomy.
CONTRAST:  100mL [id number] IOPAMIDOL ([id number]) INJECTION 76%

[Series 5: thins · axial · 0.77mm/px · z∈[+1384,+1630]mm · 15 of 271 slices shown]
[im 12/271  lung]
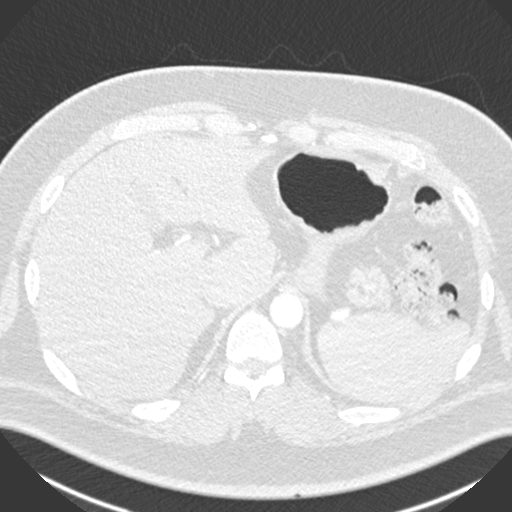
[im 36/271  soft-tissue]
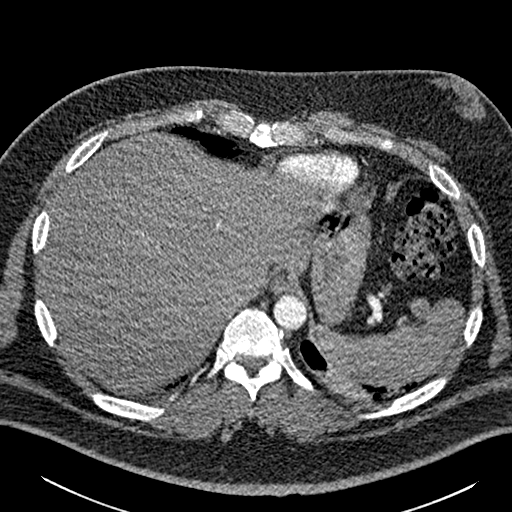
[im 47/271  lung]
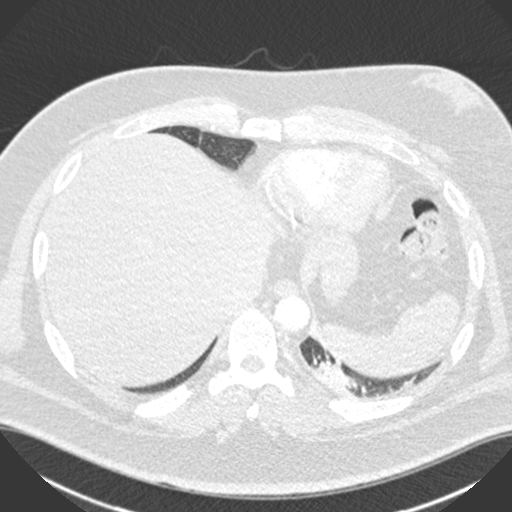
[im 71/271  soft-tissue]
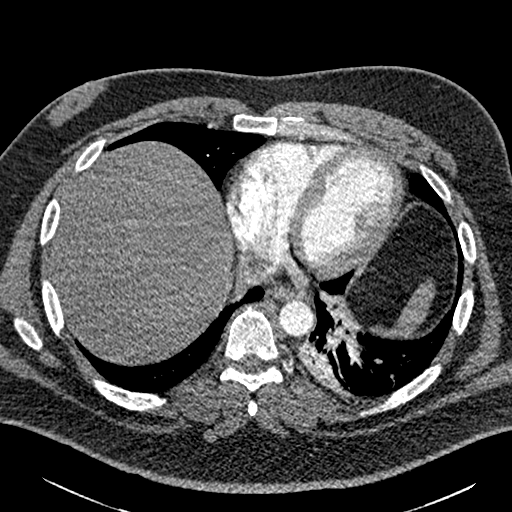
[im 83/271  lung]
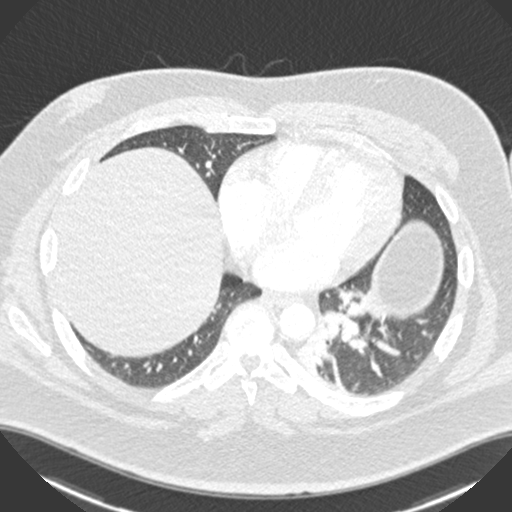
[im 106/271  soft-tissue]
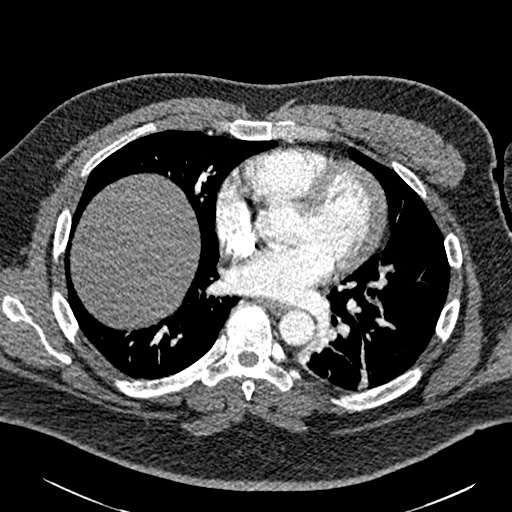
[im 118/271  lung]
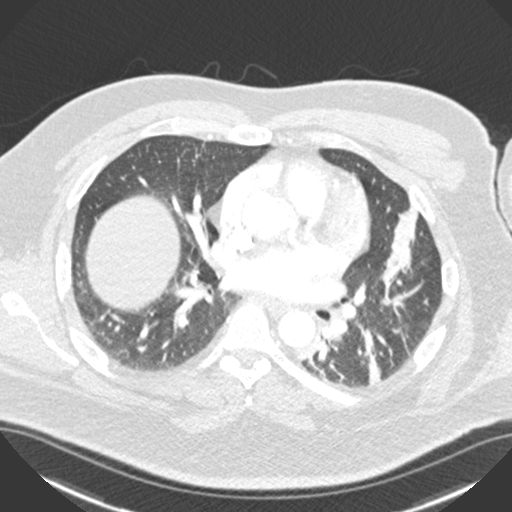
[im 141/271  soft-tissue]
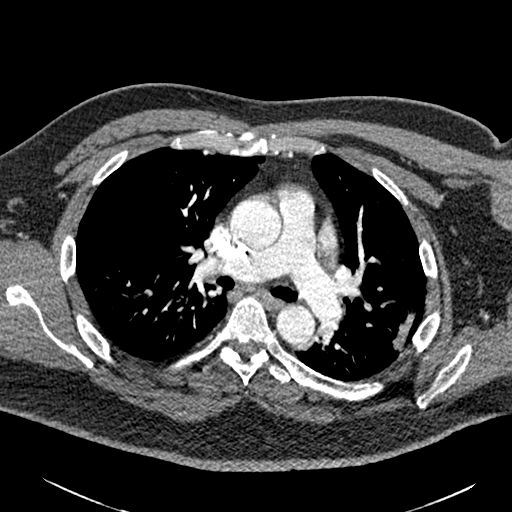
[im 153/271  lung]
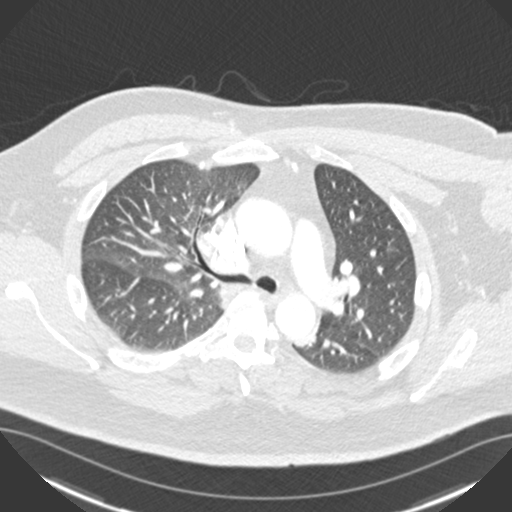
[im 165/271  soft-tissue]
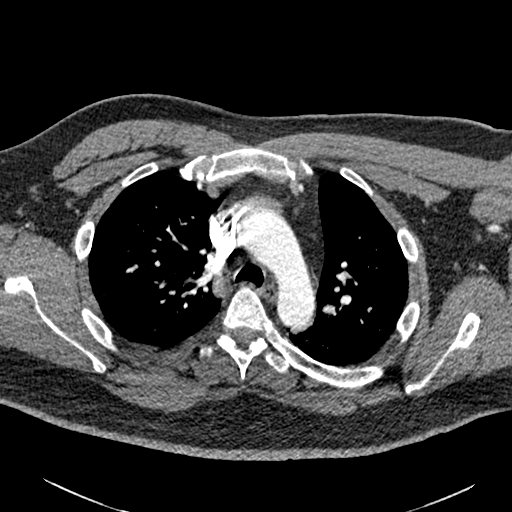
[im 188/271  lung]
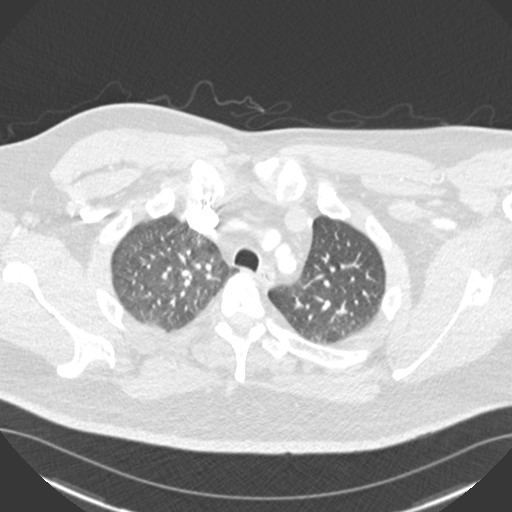
[im 200/271  soft-tissue]
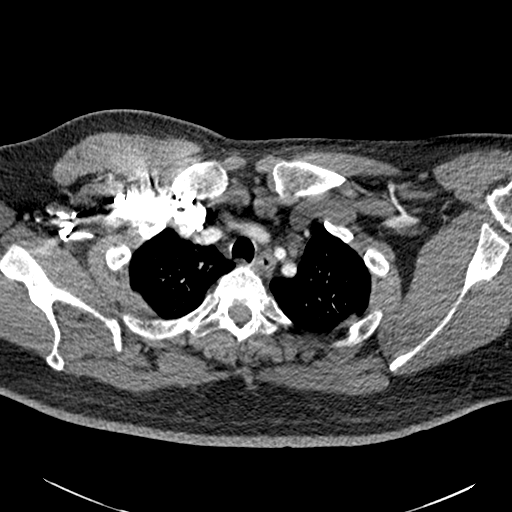
[im 224/271  lung]
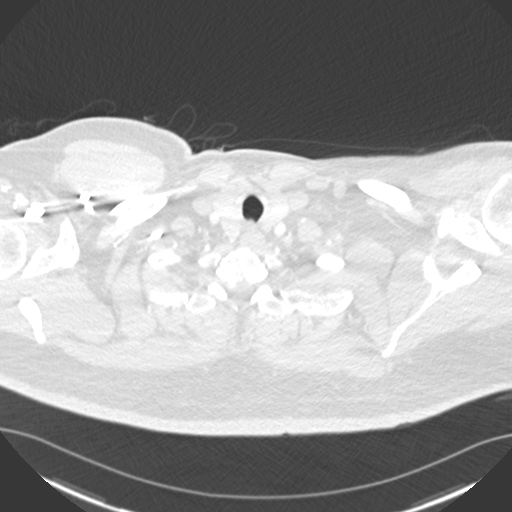
[im 235/271  soft-tissue]
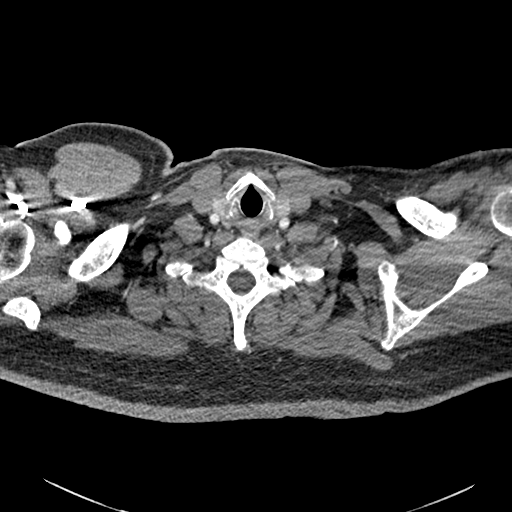
[im 259/271  lung]
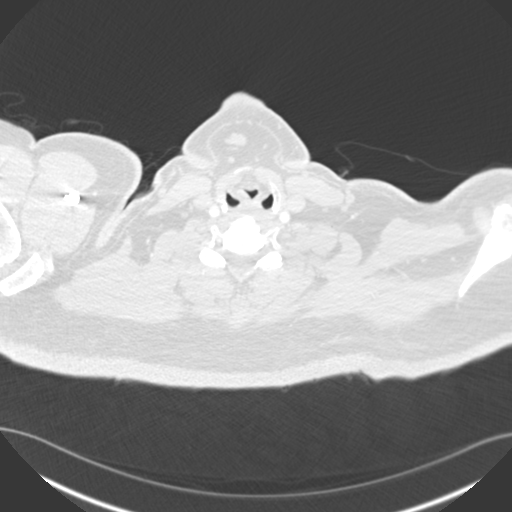

[Series 7: coronal mpr · coronal · 0.56mm/px · 3 of 168 slices shown]
[im 42/168  soft-tissue]
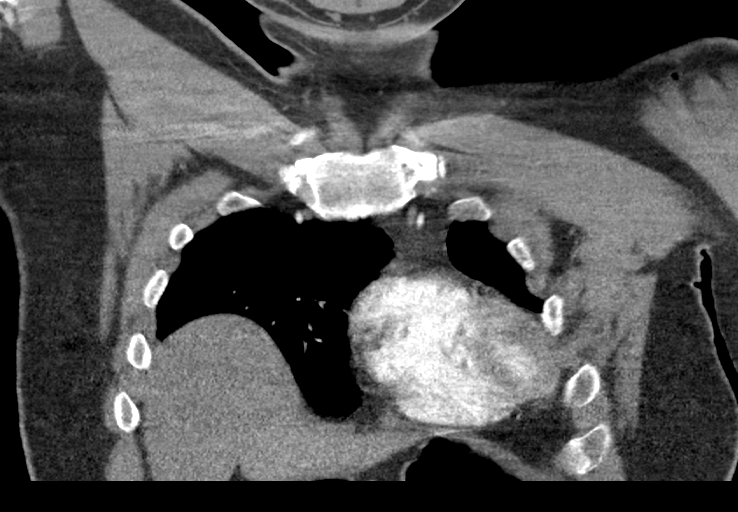
[im 84/168  soft-tissue]
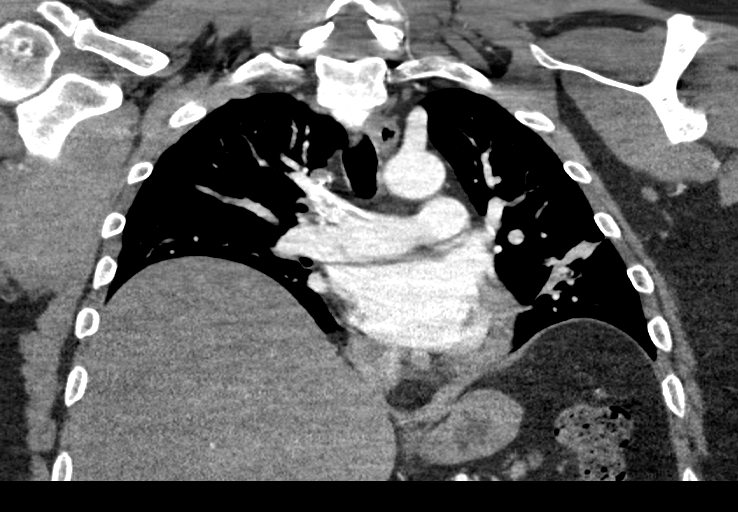
[im 126/168  soft-tissue]
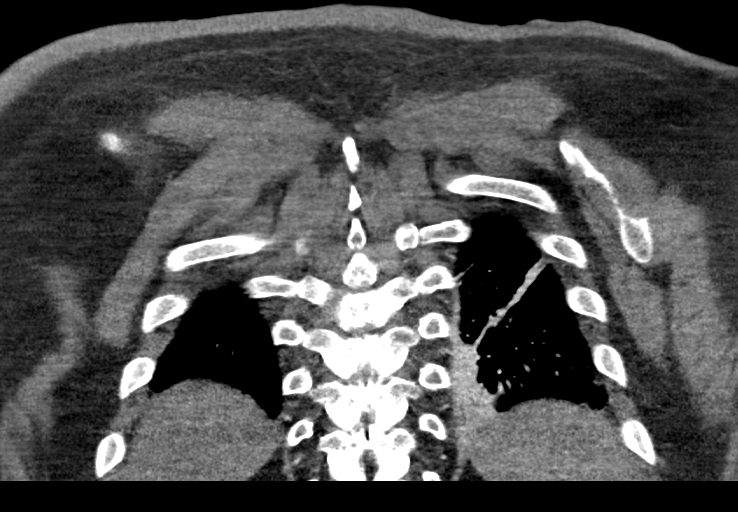

[18 of 46 positions shown; findings below may reference images not displayed]

FINDINGS: Cardiovascular: Mild respiratory motion artifact. Satisfactory
opacification of the pulmonary arteries to the segmental level. No
evidence of pulmonary embolism. Normal heart size. No pericardial
effusion.

Mediastinum/Nodes: No enlarged mediastinal, hilar, or axillary lymph
nodes. Thyroid gland, trachea, and esophagus demonstrate no
significant findings.

Lungs/Pleura: No consolidation, effusion, or pneumothorax. Low lung
volumes. Platelike atelectasis left upper and lower lobes.

Upper Abdomen: Gynecomastia. Hepatic steatosis. Cholelithiasis.

Musculoskeletal: No chest wall abnormality. No acute or significant
osseous findings.

Review of the MIP images confirms the above findings.
IMPRESSION: 1. Mild respiratory motion artifact. No pulmonary embolus
identified.
2. Low lung volumes. Platelike atelectasis left upper and lower
lobes.
3. Hepatic steatosis.

By: Rezii Hovhannesyan M.D.

## 2021-10-18 LAB — COLOGUARD: COLOGUARD: NEGATIVE

## 2022-02-04 NOTE — Telephone Encounter (Signed)
error 

## 2024-05-26 ENCOUNTER — Other Ambulatory Visit: Payer: Self-pay | Admitting: Medical Genetics

## 2024-05-27 ENCOUNTER — Other Ambulatory Visit (HOSPITAL_COMMUNITY)
Admission: RE | Admit: 2024-05-27 | Discharge: 2024-05-27 | Disposition: A | Discharge status: Home / Self Care | Payer: Self-pay | Source: Ambulatory Visit | Attending: Oncology | Admitting: Oncology

## 2024-06-06 LAB — GENECONNECT MOLECULAR SCREEN: Genetic Analysis Overall Interpretation: NEGATIVE
